# Patient Record
Sex: Female | Born: 1960
Health system: Southern US, Community
[De-identification: ages and names within clinical notes are randomized; demographics above are authoritative.]

## PROBLEM LIST (undated history)

## (undated) DIAGNOSIS — B019 Varicella without complication: Secondary | ICD-10-CM

## (undated) DIAGNOSIS — T7840XA Allergy, unspecified, initial encounter: Secondary | ICD-10-CM

## (undated) HISTORY — DX: Allergy, unspecified, initial encounter: T78.40XA

## (undated) HISTORY — DX: Varicella without complication: B01.9

## (undated) HISTORY — PX: OTHER SURGICAL HISTORY: SHX169

## (undated) HISTORY — PX: FOOT SURGERY: SHX648

---

## 1998-06-04 ENCOUNTER — Other Ambulatory Visit: Admission: RE | Admit: 1998-06-04 | Discharge: 1998-06-04 | Payer: Self-pay | Admitting: Obstetrics and Gynecology

## 2002-02-24 ENCOUNTER — Other Ambulatory Visit: Admission: RE | Admit: 2002-02-24 | Discharge: 2002-02-24 | Payer: Self-pay | Admitting: *Deleted

## 2003-12-12 ENCOUNTER — Other Ambulatory Visit: Admission: RE | Admit: 2003-12-12 | Discharge: 2003-12-12 | Payer: Self-pay | Admitting: Obstetrics & Gynecology

## 2005-04-07 ENCOUNTER — Ambulatory Visit: Payer: Self-pay | Admitting: *Deleted

## 2006-05-06 ENCOUNTER — Ambulatory Visit: Payer: Self-pay | Admitting: *Deleted

## 2009-04-04 ENCOUNTER — Ambulatory Visit: Payer: Self-pay | Admitting: Internal Medicine

## 2009-04-11 ENCOUNTER — Other Ambulatory Visit: Payer: Self-pay | Admitting: Internal Medicine

## 2009-06-28 ENCOUNTER — Ambulatory Visit: Payer: Self-pay | Admitting: Gastroenterology

## 2010-02-14 LAB — HM COLONOSCOPY: HM Colonoscopy: NORMAL

## 2010-04-05 ENCOUNTER — Ambulatory Visit: Payer: Self-pay | Admitting: Internal Medicine

## 2011-04-07 ENCOUNTER — Ambulatory Visit: Payer: Self-pay | Admitting: Internal Medicine

## 2012-03-08 ENCOUNTER — Telehealth: Payer: Self-pay | Admitting: Internal Medicine

## 2012-03-08 DIAGNOSIS — Z1239 Encounter for other screening for malignant neoplasm of breast: Secondary | ICD-10-CM

## 2012-03-08 NOTE — Telephone Encounter (Signed)
Pt called to get order for [place] she received letter stating it was time for mammogram Pt would like to go early am

## 2012-03-08 NOTE — Telephone Encounter (Signed)
This patient has not been seen by me ever.  Is she from the old practice?  Where does she want the mammogram done?

## 2012-03-09 NOTE — Telephone Encounter (Signed)
Left message asking patient to return my call.

## 2012-03-10 NOTE — Telephone Encounter (Signed)
The order was made on July 1.  Will print out.  But, Since she has not been Seen here yet,  I do not know whether she could be pregnant, which will have to be verified before they will do th e mammogram

## 2012-03-10 NOTE — Telephone Encounter (Signed)
Left another message asking patient to call back

## 2012-03-10 NOTE — Telephone Encounter (Signed)
Patient returned call and stated that she was a patient at you previous office. She would like order faxed to [facility] and she will make her own appt

## 2012-03-12 NOTE — Telephone Encounter (Signed)
Patient notified, I faxed the order to [facility].  She will call and schedule the appt.

## 2012-04-08 ENCOUNTER — Ambulatory Visit: Payer: Self-pay | Admitting: Internal Medicine

## 2012-09-08 HISTORY — PX: ABLATION SAPHENOUS VEIN W/ RFA: SUR11

## 2013-02-14 ENCOUNTER — Encounter: Payer: Self-pay | Admitting: Internal Medicine

## 2013-02-14 ENCOUNTER — Ambulatory Visit (INDEPENDENT_AMBULATORY_CARE_PROVIDER_SITE_OTHER): Payer: 59 | Admitting: Internal Medicine

## 2013-02-14 VITALS — BP 112/68 | HR 89 | Temp 98.6°F | Resp 16 | Ht 65.0 in | Wt 165.5 lb

## 2013-02-14 DIAGNOSIS — N951 Menopausal and female climacteric states: Secondary | ICD-10-CM

## 2013-02-14 DIAGNOSIS — Z1239 Encounter for other screening for malignant neoplasm of breast: Secondary | ICD-10-CM

## 2013-02-14 DIAGNOSIS — Z0000 Encounter for general adult medical examination without abnormal findings: Secondary | ICD-10-CM

## 2013-02-14 NOTE — Progress Notes (Signed)
Patient ID: Victoria Barrett, female   DOB: February 23, 1961, 51 y.o.   MRN: [id number]   Patient Active Problem List   Diagnosis Date Noted  . Perimenopausal symptoms 02/14/2013  . Routine general medical examination at a health care facility 02/14/2013    Subjective:  CC:   Chief Complaint  Patient presents with  . Establish Care    HPI:   Victoria Barrett a 52 y.o. female who presents Perimenopausal menstrual irregularity.  She has a menses every  3 to 4 weeks with bleeding lasting 7 days total  With a regular taper.  Her period came early at  3 weeks and started yesterday.  She has been having hot flashes for over a year.    Had bilateral ablation of saphenous veins in Dec by Dr. Wyn Quaker and is scheduled to have sclerotherapy for management of venous insufficiency .  She is wearing the compression stockings daily.   HM: she had a colonoscopy 2011 due to FH of colon polyps and colon CA.  Procedure was done by  Oh , was clear this time.    Has  lost 5 lbs since last visit .  She is exeecising regularly and has no persistent joint pain.    Past Medical History  Diagnosis Date  . Chicken pox     Past Surgical History  Procedure Laterality Date  . Foot surgery Right     ganglion cyst, removed by Troxler  . Salpingo ophect Left        The following portions of the patient's history were reviewed and updated as appropriate: Allergies, current medications, and problem list.    Review of Systems:   12 Pt  review of systems was negative except those addressed in the HPI,     History   Social History  . Marital Status: Single    Spouse Name: N/A    Number of Children: N/A  . Years of Education: N/A   Occupational History  . Not on file.   Social History Main Topics  . Smoking status: Never Smoker   . Smokeless tobacco: Never Used  . Alcohol Use: No  . Drug Use: No  . Sexually Active: No   Other Topics Concern  . Not on file   Social History Narrative  . No  narrative on file    Objective:  BP 112/68  Pulse 89  Temp(Src) 98.6 F (37 C) (Oral)  Resp 16  Ht 5\' 5"  (1.651 m)  Wt 165 lb 8 oz (75.07 kg)  BMI 27.54 kg/m2  SpO2 98%  LMP 02/13/2013  General appearance: alert, cooperative and appears stated age Ears: normal TM's and external ear canals both ears Throat: lips, mucosa, and tongue normal; teeth and gums normal Neck: no adenopathy, no carotid bruit, supple, symmetrical, trachea midline and thyroid not enlarged, symmetric, no tenderness/mass/nodules Back: symmetric, no curvature. ROM normal. No CVA tenderness. Lungs: clear to auscultation bilaterally Heart: regular rate and rhythm, S1, S2 normal, no murmur, click, rub or gallop Abdomen: soft, non-tender; bowel sounds normal; no masses,  no organomegaly Pulses: 2+ and symmetric Skin: Skin color, texture, turgor normal. No rashes or lesions Lymph nodes: Cervical, supraclavicular, and axillary nodes normal.  Assessment and Plan:  Perimenopausal symptoms Annual physical was planned but she has been menstruating .   Routine general medical examination at a health care facility Annual comprehensive exam was done excluding breast, pelvic and PAP smear. She will return for PAP smear when not menstruating.  Mammogram  ordered .    Updated Medication List Outpatient Encounter Prescriptions as of 02/14/2013  Medication Sig Dispense Refill  . docusate sodium (COLACE) 250 MG capsule Take 250 mg by mouth daily.      . Multiple Vitamins-Minerals (MULTIVITAMIN WITH MINERALS) tablet Take 1 tablet by mouth daily.      . naproxen sodium (ANAPROX) 220 MG tablet Take 220 mg by mouth 2 (two) times daily with a meal.       No facility-administered encounter medications on file as of 02/14/2013.     Orders Placed This Encounter  Procedures  . MM Digital Screening  . HM COLONOSCOPY    No Follow-up on file.

## 2013-02-14 NOTE — Patient Instructions (Addendum)
Return for a pelvic exam and fasting bloodwork

## 2013-02-14 NOTE — Assessment & Plan Note (Signed)
Annual physical was planned but she has been menstruating .

## 2013-02-14 NOTE — Assessment & Plan Note (Signed)
Annual comprehensive exam was done excluding breast, pelvic and PAP smear. She will return for PAP smear when not menstruating.  Mammogram ordered .

## 2013-03-08 ENCOUNTER — Encounter: Payer: Self-pay | Admitting: Internal Medicine

## 2013-03-08 ENCOUNTER — Other Ambulatory Visit (HOSPITAL_COMMUNITY)
Admission: RE | Admit: 2013-03-08 | Discharge: 2013-03-08 | Disposition: A | Discharge status: Home / Self Care | Payer: 59 | Source: Ambulatory Visit | Attending: Internal Medicine | Admitting: Internal Medicine

## 2013-03-08 ENCOUNTER — Ambulatory Visit (INDEPENDENT_AMBULATORY_CARE_PROVIDER_SITE_OTHER): Payer: 59 | Admitting: Internal Medicine

## 2013-03-08 VITALS — BP 110/72 | HR 70 | Temp 98.6°F | Resp 14 | Wt 163.2 lb

## 2013-03-08 DIAGNOSIS — Z1151 Encounter for screening for human papillomavirus (HPV): Secondary | ICD-10-CM | POA: Insufficient documentation

## 2013-03-08 DIAGNOSIS — Z01419 Encounter for gynecological examination (general) (routine) without abnormal findings: Secondary | ICD-10-CM | POA: Insufficient documentation

## 2013-03-08 DIAGNOSIS — Z0000 Encounter for general adult medical examination without abnormal findings: Secondary | ICD-10-CM

## 2013-03-08 DIAGNOSIS — R5381 Other malaise: Secondary | ICD-10-CM

## 2013-03-08 DIAGNOSIS — Z13220 Encounter for screening for lipoid disorders: Secondary | ICD-10-CM

## 2013-03-08 LAB — LIPID PANEL
Cholesterol: 172 mg/dL (ref 0–200)
HDL: 53.5 mg/dL (ref >39.00)
Triglycerides: 137 mg/dL (ref 0.0–149.0)
VLDL: 27.4 mg/dL (ref 0.0–40.0)

## 2013-03-08 LAB — CBC WITH DIFFERENTIAL/PLATELET
Basophils Absolute: 0.1 10*3/uL (ref 0.0–0.1)
Eosinophils Relative: 5.5 % — ABNORMAL HIGH (ref 0.0–5.0)
HCT: 40.9 % (ref 36.0–46.0)
Lymphocytes Relative: 22 % (ref 12.0–46.0)
Lymphs Abs: 1.7 10*3/uL (ref 0.7–4.0)
Monocytes Relative: 8.5 % (ref 3.0–12.0)
Neutrophils Relative %: 63.2 % (ref 43.0–77.0)
Platelets: 197 10*3/uL (ref 150.0–400.0)
RDW: 14.5 % (ref 11.5–14.6)
WBC: 7.8 10*3/uL (ref 4.5–10.5)

## 2013-03-08 LAB — TSH: TSH: 1.4 u[IU]/mL (ref 0.35–5.50)

## 2013-03-08 LAB — COMPREHENSIVE METABOLIC PANEL
Albumin: 4.2 g/dL (ref 3.5–5.2)
Alkaline Phosphatase: 42 U/L (ref 39–117)
BUN: 15 mg/dL (ref 6–23)
Glucose, Bld: 93 mg/dL (ref 70–99)
Potassium: 4.3 mEq/L (ref 3.5–5.1)
Total Bilirubin: 0.7 mg/dL (ref 0.3–1.2)

## 2013-03-08 LAB — HM PAP SMEAR

## 2013-03-08 NOTE — Progress Notes (Signed)
Patient ID: Victoria Barrett, female   DOB: [birth year], 52 y.o.   MRN: [id number]    Subjective:     Victoria Barrett is a 52 y.o. female here for a routine exam.  Current complaints:   Personal health questionnaire reviewed: yes.   Gynecologic History Patient's last menstrual period was 02/12/2013. Contraception: abstinence Last Pap:  2008 Results were: normal Last mammogram: 2013. Results were: normal  Obstetric History OB History   Grav Para Term Preterm Abortions TAB SAB Ect Mult Living                   The following portions of the patient's history were reviewed and updated as appropriate: allergies, current medications, past family history, past medical history, past social history, past surgical history and problem list.  Review of Systems A comprehensive review of systems was negative.    Objective:     .BP 110/72  Pulse 70  Temp(Src) 98.6 F (37 C) (Oral)  Resp 14  Wt 163 lb 4 oz (74.05 kg)  BMI 27.17 kg/m2  SpO2 98%  LMP 02/12/2013  General Appearance:    Alert, cooperative, no distress, appears stated age  Head:    Normocephalic, without obvious abnormality, atraumatic  Eyes:    PERRL, conjunctiva/corneas clear, EOM's intact, fundi    benign, both eyes  Ears:    Normal TM's and external ear canals, both ears  Nose:   Nares normal, septum midline, mucosa normal, no drainage    or sinus tenderness  Throat:   Lips, mucosa, and tongue normal; teeth and gums normal  Neck:   Supple, symmetrical, trachea midline, no adenopathy;    thyroid:  no enlargement/tenderness/nodules; no carotid   bruit or JVD  Back:     Symmetric, no curvature, ROM normal, no CVA tenderness  Lungs:     Clear to auscultation bilaterally, respirations unlabored  Chest Wall:    No tenderness or deformity   Heart:    Regular rate and rhythm, S1 and S2 normal, no murmur, rub   or gallop  Breast Exam:    No tenderness, masses, or nipple abnormality  Abdomen:     Soft, non-tender, bowel sounds  active all four quadrants,    no masses, no organomegaly  Genitalia:    Pelvic: cervix normal in appearance, external genitalia normal, no adnexal masses or tenderness, no cervical motion tenderness, rectovaginal septum normal, uterus normal size, shape, and consistency and vagina normal without discharge  Extremities:   Extremities normal, atraumatic, no cyanosis or edema  Pulses:   2+ and symmetric all extremities  Skin:   Skin color, texture, turgor normal, no rashes or lesions  Lymph nodes:   Cervical, supraclavicular, and axillary nodes normal  Neurologic:   CNII-XII intact, normal strength, sensation and reflexes    throughout        Assessment:   Routine general medical examination at a health care facility Annual comprehensive exam was done including breast, pelvic and PAP smear. All screenings have been addressed .    Updated Medication List Outpatient Encounter Prescriptions as of 03/08/2013  Medication Sig Dispense Refill  . docusate sodium (COLACE) 250 MG capsule Take 250 mg by mouth daily.      . Multiple Vitamins-Minerals (MULTIVITAMIN WITH MINERALS) tablet Take 1 tablet by mouth daily.      . naproxen sodium (ANAPROX) 220 MG tablet Take 220 mg by mouth 2 (two) times daily with a meal.       No  facility-administered encounter medications on file as of 03/08/2013.

## 2013-03-09 ENCOUNTER — Encounter: Payer: Self-pay | Admitting: Internal Medicine

## 2013-03-09 NOTE — Assessment & Plan Note (Signed)
Annual comprehensive exam was done including breast, pelvic and PAP smear. All screenings have been addressed .  

## 2013-03-10 ENCOUNTER — Encounter: Payer: Self-pay | Admitting: Internal Medicine

## 2013-03-15 ENCOUNTER — Encounter: Payer: Self-pay | Admitting: Internal Medicine

## 2013-04-03 LAB — HM PAP SMEAR: HM PAP: NEGATIVE

## 2013-04-13 ENCOUNTER — Ambulatory Visit: Payer: Self-pay | Admitting: Internal Medicine

## 2013-04-13 LAB — HM MAMMOGRAPHY: HM MAMMO: NORMAL

## 2013-05-03 ENCOUNTER — Encounter: Payer: Self-pay | Admitting: Internal Medicine

## 2013-07-14 ENCOUNTER — Other Ambulatory Visit: Payer: Self-pay

## 2014-04-03 ENCOUNTER — Encounter: Payer: Self-pay | Admitting: Internal Medicine

## 2014-04-03 ENCOUNTER — Ambulatory Visit (INDEPENDENT_AMBULATORY_CARE_PROVIDER_SITE_OTHER): Payer: 59 | Admitting: Internal Medicine

## 2014-04-03 VITALS — BP 120/78 | HR 85 | Temp 98.1°F | Resp 16 | Ht 65.75 in | Wt 167.2 lb

## 2014-04-03 DIAGNOSIS — Z0000 Encounter for general adult medical examination without abnormal findings: Secondary | ICD-10-CM

## 2014-04-03 DIAGNOSIS — K5904 Chronic idiopathic constipation: Secondary | ICD-10-CM

## 2014-04-03 DIAGNOSIS — N951 Menopausal and female climacteric states: Secondary | ICD-10-CM

## 2014-04-03 DIAGNOSIS — Z1239 Encounter for other screening for malignant neoplasm of breast: Secondary | ICD-10-CM

## 2014-04-03 DIAGNOSIS — K5909 Other constipation: Secondary | ICD-10-CM

## 2014-04-03 MED ORDER — LINACLOTIDE 290 MCG PO CAPS: 290.0000 ug | ORAL_CAPSULE | Freq: Every day | ORAL | Status: DC

## 2014-04-03 NOTE — Progress Notes (Signed)
Patient ID: Victoria Barrett, female   DOB: 06/14/1961, 53 y.o.   MRN: [id number]    Subjective:     Victoria Barrett is a 53 y.o. female and is here for a comprehensive physical exam. The patient reports problems - as  follows.annual nongyn exam    using estroblend OTC for daytime hot flashes and moodiness.  peirods are regular and still heavy.  Travel constipation still occurring.      History   Social History  . Marital Status: Single    Spouse Name: N/A    Number of Children: N/A  . Years of Education: N/A   Occupational History  . Not on file.   Social History Main Topics  . Smoking status: Never Smoker   . Smokeless tobacco: Never Used  . Alcohol Use: No  . Drug Use: No  . Sexual Activity: No   Other Topics Concern  . Not on file   Social History Narrative  . No narrative on file   Health Maintenance  Topic Date Due  . Influenza Vaccine  04/08/2014  . Mammogram  04/14/2015  . Pap Smear  04/03/2016  . Tetanus/tdap  12/17/2019  . Colonoscopy  02/15/2020    The following portions of the patient's history were reviewed and updated as appropriate: current medications, past family history, past medical history, past social history, past surgical history and problem list.  Review of Systems A comprehensive review of systems was negative.   Objective:  BP 120/78  Pulse 85  Temp(Src) 98.1 F (36.7 C) (Oral)  Resp 16  Ht 5' 5.75" (1.67 m)  Wt 167 lb 4 oz (75.864 kg)  BMI 27.20 kg/m2  SpO2 98%  LMP 03/06/2014  General appearance: alert, cooperative and appears stated age Head: Normocephalic, without obvious abnormality, atraumatic Eyes: conjunctivae/corneas clear. PERRL, EOM's intact. Fundi benign. Ears: normal TM's and external ear canals both ears Nose: Nares normal. Septum midline. Mucosa normal. No drainage or sinus tenderness. Throat: lips, mucosa, and tongue normal; teeth and gums normal Neck: no adenopathy, no carotid bruit, no JVD, supple,  symmetrical, trachea midline and thyroid not enlarged, symmetric, no tenderness/mass/nodules Lungs: clear to auscultation bilaterally Breasts: normal appearance, no masses or tenderness Heart: regular rate and rhythm, S1, S2 normal, no murmur, click, rub or gallop Abdomen: soft, non-tender; bowel sounds normal; no masses,  no organomegaly Extremities: extremities normal, atraumatic, no cyanosis or edema Pulses: 2+ and symmetric Skin: Skin color, texture, turgor normal. No rashes or lesions Neurologic: Alert and oriented X 3, normal strength and tone. Normal symmetric reflexes. Normal coordination and gait.   Assessment and Plan:   Routine general medical examination at a health care facility Annual comprehensive exam was done including breast, excluding  pelvic and PAP smear. All screenings have been addressed .   Perimenopausal symptoms Managed with Estroblend, an OTC soy based medication   Constipation - functional Occurring when away from home .  Trial of Linzess advised.    Updated Medication List Outpatient Encounter Prescriptions as of 04/03/2014  Medication Sig  . docusate sodium (COLACE) 250 MG capsule Take 250 mg by mouth daily.  . Multiple Vitamins-Minerals (MULTIVITAMIN WITH MINERALS) tablet Take 1 tablet by mouth daily.  . naproxen sodium (ANAPROX) 220 MG tablet Take 220 mg by mouth 2 (two) times daily with a meal.  . Nutritional Supplements (EQ ESTROBLEND MENOPAUSE PO) Take 1 tablet by mouth daily.  . Linaclotide (LINZESS) 290 MCG CAPS capsule Take 1 capsule (290 mcg total) by mouth  daily.

## 2014-04-03 NOTE — Progress Notes (Signed)
Pre-visit discussion using our clinic review tool. No additional management support is needed unless otherwise documented below in the visit note.  

## 2014-04-03 NOTE — Patient Instructions (Signed)
You had your annual  wellness exam today.  We will repeat your PAP smear in 2017, sooner if needed   We will schedule your mammogram in August

## 2014-04-04 DIAGNOSIS — K5904 Chronic idiopathic constipation: Secondary | ICD-10-CM | POA: Insufficient documentation

## 2014-04-04 NOTE — Assessment & Plan Note (Signed)
Managed with Estroblend, an OTC soy based medication

## 2014-04-04 NOTE — Assessment & Plan Note (Signed)
Occurring when away from home .  Trial of Linzess advised.

## 2014-04-04 NOTE — Assessment & Plan Note (Signed)
Annual comprehensive exam was done including breast, excluding pelvic and PAP smear. All screenings have been addressed .  

## 2014-05-23 ENCOUNTER — Ambulatory Visit: Payer: Self-pay | Admitting: Internal Medicine

## 2014-12-11 ENCOUNTER — Encounter: Payer: Self-pay | Admitting: *Deleted

## 2015-05-23 ENCOUNTER — Encounter: Payer: Self-pay | Admitting: Internal Medicine

## 2015-05-23 ENCOUNTER — Other Ambulatory Visit: Payer: Self-pay | Admitting: Internal Medicine

## 2015-05-23 ENCOUNTER — Ambulatory Visit (INDEPENDENT_AMBULATORY_CARE_PROVIDER_SITE_OTHER): Payer: 59 | Admitting: Internal Medicine

## 2015-05-23 VITALS — BP 114/72 | HR 84 | Temp 98.2°F | Resp 14 | Ht 65.5 in | Wt 170.5 lb

## 2015-05-23 DIAGNOSIS — R350 Frequency of micturition: Secondary | ICD-10-CM

## 2015-05-23 DIAGNOSIS — Z0000 Encounter for general adult medical examination without abnormal findings: Secondary | ICD-10-CM | POA: Diagnosis not present

## 2015-05-23 DIAGNOSIS — E559 Vitamin D deficiency, unspecified: Secondary | ICD-10-CM | POA: Diagnosis not present

## 2015-05-23 DIAGNOSIS — Z113 Encounter for screening for infections with a predominantly sexual mode of transmission: Secondary | ICD-10-CM

## 2015-05-23 DIAGNOSIS — Z1239 Encounter for other screening for malignant neoplasm of breast: Secondary | ICD-10-CM | POA: Diagnosis not present

## 2015-05-23 DIAGNOSIS — R5383 Other fatigue: Secondary | ICD-10-CM | POA: Diagnosis not present

## 2015-05-23 DIAGNOSIS — E785 Hyperlipidemia, unspecified: Secondary | ICD-10-CM | POA: Diagnosis not present

## 2015-05-23 LAB — URINALYSIS, ROUTINE W REFLEX MICROSCOPIC
BILIRUBIN URINE: NEGATIVE
Hgb urine dipstick: NEGATIVE
KETONES UR: NEGATIVE
LEUKOCYTES UA: NEGATIVE
Nitrite: NEGATIVE
PH: 7.5 (ref 5.0–8.0)
RBC / HPF: NONE SEEN (ref 0–?)
Specific Gravity, Urine: 1.015 (ref 1.000–1.030)
TOTAL PROTEIN, URINE-UPE24: NEGATIVE
UROBILINOGEN UA: 0.2 (ref 0.0–1.0)
Urine Glucose: NEGATIVE

## 2015-05-23 LAB — COMPREHENSIVE METABOLIC PANEL
ALBUMIN: 4.1 g/dL (ref 3.5–5.2)
ALT: 17 U/L (ref 0–35)
AST: 18 U/L (ref 0–37)
Alkaline Phosphatase: 48 U/L (ref 39–117)
BUN: 8 mg/dL (ref 6–23)
CHLORIDE: 104 meq/L (ref 96–112)
CO2: 27 mEq/L (ref 19–32)
Calcium: 9.2 mg/dL (ref 8.4–10.5)
Creatinine, Ser: 0.62 mg/dL (ref 0.40–1.20)
GFR: 106.81 mL/min (ref >60.00)
GLUCOSE: 86 mg/dL (ref 70–99)
Potassium: 4.3 mEq/L (ref 3.5–5.1)
Sodium: 138 mEq/L (ref 135–145)
Total Bilirubin: 0.3 mg/dL (ref 0.2–1.2)
Total Protein: 7.3 g/dL (ref 6.0–8.3)

## 2015-05-23 LAB — CBC WITH DIFFERENTIAL/PLATELET
BASOS ABS: 0 10*3/uL (ref 0.0–0.1)
Basophils Relative: 0.4 % (ref 0.0–3.0)
Eosinophils Absolute: 0.2 10*3/uL (ref 0.0–0.7)
Eosinophils Relative: 3 % (ref 0.0–5.0)
HCT: 41.7 % (ref 36.0–46.0)
Hemoglobin: 13.9 g/dL (ref 12.0–15.0)
LYMPHS ABS: 2 10*3/uL (ref 0.7–4.0)
Lymphocytes Relative: 25.1 % (ref 12.0–46.0)
MCHC: 33.4 g/dL (ref 30.0–36.0)
MCV: 90.9 fl (ref 78.0–100.0)
MONOS PCT: 8.5 % (ref 3.0–12.0)
Monocytes Absolute: 0.7 10*3/uL (ref 0.1–1.0)
NEUTROS ABS: 5.1 10*3/uL (ref 1.4–7.7)
NEUTROS PCT: 63 % (ref 43.0–77.0)
PLATELETS: 227 10*3/uL (ref 150.0–400.0)
RBC: 4.59 Mil/uL (ref 3.87–5.11)
RDW: 14.4 % (ref 11.5–15.5)
WBC: 8.1 10*3/uL (ref 4.0–10.5)

## 2015-05-23 LAB — LIPID PANEL
CHOL/HDL RATIO: 3
Cholesterol: 188 mg/dL (ref 0–200)
HDL: 59.3 mg/dL (ref >39.00)
LDL Cholesterol: 92 mg/dL (ref 0–99)
NONHDL: 128.41
Triglycerides: 181 mg/dL — ABNORMAL HIGH (ref 0.0–149.0)
VLDL: 36.2 mg/dL (ref 0.0–40.0)

## 2015-05-23 LAB — VITAMIN D 25 HYDROXY (VIT D DEFICIENCY, FRACTURES): VITD: 25.63 ng/mL — AB (ref 30.00–100.00)

## 2015-05-23 LAB — TSH: TSH: 1.69 u[IU]/mL (ref 0.35–4.50)

## 2015-05-23 NOTE — Patient Instructions (Signed)

## 2015-05-23 NOTE — Progress Notes (Signed)
Pre-visit discussion using our clinic review tool. No additional management support is needed unless otherwise documented below in the visit note.  

## 2015-05-24 ENCOUNTER — Ambulatory Visit: Payer: 59 | Admitting: Anesthesiology

## 2015-05-24 ENCOUNTER — Ambulatory Visit
Admission: RE | Admit: 2015-05-24 | Discharge: 2015-05-24 | Disposition: A | Discharge status: Home / Self Care | Payer: 59 | Source: Ambulatory Visit | Attending: Gastroenterology | Admitting: Gastroenterology

## 2015-05-24 ENCOUNTER — Encounter
Admission: RE | Disposition: A | Discharge status: Home / Self Care | Payer: Self-pay | Source: Ambulatory Visit | Attending: Gastroenterology

## 2015-05-24 DIAGNOSIS — Z885 Allergy status to narcotic agent status: Secondary | ICD-10-CM | POA: Insufficient documentation

## 2015-05-24 DIAGNOSIS — Z1211 Encounter for screening for malignant neoplasm of colon: Secondary | ICD-10-CM | POA: Diagnosis present

## 2015-05-24 DIAGNOSIS — K5730 Diverticulosis of large intestine without perforation or abscess without bleeding: Secondary | ICD-10-CM | POA: Insufficient documentation

## 2015-05-24 DIAGNOSIS — Z79899 Other long term (current) drug therapy: Secondary | ICD-10-CM | POA: Diagnosis not present

## 2015-05-24 DIAGNOSIS — Z800 Family history of malignant neoplasm of digestive organs: Secondary | ICD-10-CM | POA: Diagnosis not present

## 2015-05-24 HISTORY — PX: COLONOSCOPY WITH PROPOFOL: SHX5780

## 2015-05-24 LAB — HEPATITIS C ANTIBODY: HCV Ab: NEGATIVE

## 2015-05-24 LAB — HIV ANTIBODY (ROUTINE TESTING W REFLEX): HIV 1&2 Ab, 4th Generation: NONREACTIVE

## 2015-05-24 LAB — HM COLONOSCOPY

## 2015-05-24 SURGERY — COLONOSCOPY WITH PROPOFOL
Anesthesia: General

## 2015-05-24 MED ORDER — SODIUM CHLORIDE 0.9 % IV SOLN
INTRAVENOUS | Status: DC
  Administered 2015-05-24 (×2): via INTRAVENOUS

## 2015-05-24 MED ORDER — FENTANYL CITRATE (PF) 100 MCG/2ML IJ SOLN
INTRAMUSCULAR | Status: DC | PRN
  Administered 2015-05-24: 50 ug via INTRAVENOUS

## 2015-05-24 MED ORDER — SODIUM CHLORIDE 0.9 % IV SOLN: INTRAVENOUS | Status: DC

## 2015-05-24 MED ORDER — MIDAZOLAM HCL 2 MG/2ML IJ SOLN
INTRAMUSCULAR | Status: DC | PRN
  Administered 2015-05-24: 1 mg via INTRAVENOUS

## 2015-05-24 NOTE — Op Note (Signed)
[facility] Patient Name: Victoria Barrett Procedure Date: 05/24/2015 11:39 AM MRN: [id number] Account #: [id number] Date of Birth: Nov 11, 1960 Admit Type: Outpatient Age: 54 Room: [facility] ENDO ROOM 3 Gender: Female Note Status: Finalized Procedure:         Colonoscopy Indications:       Family history of colon cancer in a first-degree relative,                     Family history of colonic polyps in a first-degree relative Providers:         Ezzard Standing. Bluford Kaufmann, MD Referring MD:      Duncan Dull, MD (Referring MD) Medicines:         Monitored Anesthesia Care Complications:     No immediate complications. Procedure:         Pre-Anesthesia Assessment:                    - Prior to the procedure, a History and Physical was                     performed, and patient medications, allergies and                     sensitivities were reviewed. The patient's tolerance of                     previous anesthesia was reviewed.                    - The risks and benefits of the procedure and the sedation                     options and risks were discussed with the patient. All                     questions were answered and informed consent was obtained.                    - After reviewing the risks and benefits, the patient was                     deemed in satisfactory condition to undergo the procedure.                    After obtaining informed consent, the colonoscope was                     passed under direct vision. Throughout the procedure, the                     patient's blood pressure, pulse, and oxygen saturations                     were monitored continuously. The Olympus CF-H180AL                     colonoscope ( S#: [id number] ) was introduced through the                     anus and advanced to the the cecum, identified by                     appendiceal orifice and ileocecal valve. The colonoscopy  was performed without difficulty. The  patient tolerated                     the procedure well. The quality of the bowel preparation                     was fair. Findings:      A few small-mouthed diverticula were found in the sigmoid colon.      The exam was otherwise without abnormality. Impression:        - Diverticulosis in the sigmoid colon.                    - The examination was otherwise normal.                    - No specimens collected. Recommendation:    - Discharge patient to home.                    - Repeat colonoscopy in 5 years for surveillance.                    - The findings and recommendations were discussed with the                     patient. Procedure Code(s): --- Professional ---                    [phone], Colonoscopy, flexible; diagnostic, including                     collection of specimen(s) by brushing or washing, when                     performed (separate procedure) Diagnosis Code(s): --- Professional ---                    Z80.0, Family history of malignant neoplasm of digestive                     organs                    Z83.71, Family history of colonic polyps                    K57.30, Diverticulosis of large intestine without                     perforation or abscess without bleeding CPT copyright 2014 American Medical Association. All rights reserved. The codes documented in this report are preliminary and upon coder review may  be revised to meet current compliance requirements. Wallace Cullens, MD 05/24/2015 11:56:45 AM This report has been signed electronically. Number of Addenda: 0 Note Initiated On: 05/24/2015 11:39 AM Scope Withdrawal Time: 0 hours 4 minutes 36 seconds  Total Procedure Duration: 0 hours 9 minutes 35 seconds       [facility]

## 2015-05-24 NOTE — H&P (Signed)
Primary Care Physician:  Sherlene Shams, MD Primary Gastroenterologist:  Dr. Bluford Kaufmann  Pre-Procedure History & Physical: HPI:  Victoria Barrett is a 54 y.o. female is here for an colonoscopy   Past Medical History  Diagnosis Date  . Chicken pox     Past Surgical History  Procedure Laterality Date  . Foot surgery Right     ganglion cyst, removed by Troxler  . Salpingo ophect Left   . Ablation saphenous vein w/ rfa Bilateral 2014    Prior to Admission medications   Medication Sig Start Date End Date Taking? Authorizing Provider  docusate sodium (COLACE) 250 MG capsule Take 250 mg by mouth daily.   Yes Historical Provider, MD  Linaclotide (LINZESS) 290 MCG CAPS capsule Take 1 capsule (290 mcg total) by mouth daily. Patient taking differently: Take 290 mcg by mouth daily as needed.  04/03/14  Yes Sherlene Shams, MD  Multiple Vitamins-Minerals (MULTIVITAMIN WITH MINERALS) tablet Take 1 tablet by mouth daily.   Yes Historical Provider, MD  naproxen sodium (ANAPROX) 220 MG tablet Take 220 mg by mouth 2 (two) times daily with a meal.   Yes Historical Provider, MD  Nutritional Supplements (EQ ESTROBLEND MENOPAUSE PO) Take 1 tablet by mouth daily.    Historical Provider, MD    Allergies as of 04/18/2015 - Review Complete 04/03/2014  Allergen Reaction Noted  . Codeine Other (See Comments) 02/14/2013    Family History  Problem Relation Age of Onset  . Arthritis Mother   . Diabetes Mother   . Hypertension Mother   . Heart disease Maternal Grandmother   . Hypertension Maternal Grandmother   . Heart disease Maternal Grandfather   . Heart disease Paternal Grandmother   . Cancer Paternal Grandmother     colon polyps  . Heart disease Paternal Grandfather   . Cancer Father 54    colon polyps, bladder Cancer stage 1  . Cancer Sister     colon polyps  . Cancer Maternal Uncle 22    colon    Social History   Social History  . Marital Status: Single    Spouse Name: N/A  . Number of  Children: N/A  . Years of Education: N/A   Occupational History  . Not on file.   Social History Main Topics  . Smoking status: Never Smoker   . Smokeless tobacco: Never Used  . Alcohol Use: No  . Drug Use: No  . Sexual Activity: No   Other Topics Concern  . Not on file   Social History Narrative    Review of Systems: See HPI, otherwise negative ROS  Physical Exam: BP 136/64 mmHg  Pulse 78  Temp(Src) 97.5 F (36.4 C) (Tympanic)  Resp 18  Ht 5' 5.5" (1.664 m)  Wt 77.111 kg (170 lb)  BMI 27.85 kg/m2  SpO2 100%  LMP 04/23/2015 General:   Alert,  pleasant and cooperative in NAD Head:  Normocephalic and atraumatic. Neck:  Supple; no masses or thyromegaly. Lungs:  Clear throughout to auscultation.    Heart:  Regular rate and rhythm. Abdomen:  Soft, nontender and nondistended. Normal bowel sounds, without guarding, and without rebound.   Neurologic:  Alert and  oriented x4;  grossly normal neurologically.  Impression/Plan: Victoria Barrett is here for an colonoscopy to be performed for family hx of colon cancer and polyps.  Risks, benefits, limitations, and alternatives regarding  colonoscopy have been reviewed with the patient.  Questions have been answered.  All parties  agreeable.   Victoria Barrett, Victoria Standing, MD  05/24/2015, 11:35 AM

## 2015-05-24 NOTE — Anesthesia Postprocedure Evaluation (Signed)
  Anesthesia Post-op Note  Patient: Victoria Barrett  Procedure(s) Performed: Procedure(s): COLONOSCOPY WITH PROPOFOL (N/A)  Anesthesia type:General  Patient location: PACU  Post pain: Pain level controlled  Post assessment: Post-op Vital signs reviewed, Patient's Cardiovascular Status Stable, Respiratory Function Stable, Patent Airway and No signs of Nausea or vomiting  Post vital signs: Reviewed and stable  Last Vitals:  Filed Vitals:   05/24/15 1210  BP: 118/69  Pulse: 82  Temp:   Resp: 9    Level of consciousness: awake, alert  and patient cooperative  Complications: No apparent anesthesia complications

## 2015-05-24 NOTE — Assessment & Plan Note (Addendum)
Annual wellness  exam was done as well as a comprehensive physical exam  .  During the course of the visit the patient was educated and counseled about appropriate screening and preventive services and screenings were brought up to date for cervical and breast cancer .  She has had  fasting labs which show no evidence of hyperlipidemia or diabetes. nutrition counseling, skin cancer screening has been done and she has been advised to follow up with dermatology for an annual skin check . Marland Kitchen  She gets her age appropiate recommended immunizations at [facility] as an employee.  Printed recommendations for health maintenance screenings was given.   Lab Results  Component Value Date   CHOL 188 05/23/2015   HDL 59.30 05/23/2015   LDLCALC 92 05/23/2015   TRIG 181.0* 05/23/2015   CHOLHDL 3 05/23/2015

## 2015-05-24 NOTE — Transfer of Care (Signed)
Immediate Anesthesia Transfer of Care Note  Patient: Victoria Barrett  Procedure(s) Performed: Procedure(s): COLONOSCOPY WITH PROPOFOL (N/A)  Patient Location: PACU and Endoscopy Unit  Anesthesia Type:General  Level of Consciousness: patient cooperative and lethargic  Airway & Oxygen Therapy: Patient Spontanous Breathing and Patient connected to nasal cannula oxygen  Post-op Assessment: Report given to RN and Post -op Vital signs reviewed and stable  Post vital signs: Reviewed and stable  Last Vitals:  Filed Vitals:   05/24/15 1202  BP: 129/66  Pulse: 96  Temp: 36.2 C  Resp: 20    Complications: No apparent anesthesia complications

## 2015-05-24 NOTE — Anesthesia Preprocedure Evaluation (Signed)
Anesthesia Evaluation  Patient identified by MRN, date of birth, ID band Patient awake    Reviewed: Allergy & Precautions, NPO status , Patient's Chart, lab work & pertinent test results  Airway Mallampati: II       Dental no notable dental hx.    Pulmonary neg pulmonary ROS,    Pulmonary exam normal        Cardiovascular negative cardio ROS Normal cardiovascular exam     Neuro/Psych    GI/Hepatic negative GI ROS, Neg liver ROS,   Endo/Other  negative endocrine ROS  Renal/GU negative Renal ROS     Musculoskeletal negative musculoskeletal ROS (+)   Abdominal Normal abdominal exam  (+)   Peds negative pediatric ROS (+)  Hematology negative hematology ROS (+)   Anesthesia Other Findings   Reproductive/Obstetrics                             Anesthesia Physical Anesthesia Plan  ASA: II  Anesthesia Plan: General   Post-op Pain Management:    Induction: Intravenous  Airway Management Planned: Nasal Cannula  Additional Equipment:   Intra-op Plan:   Post-operative Plan:   Informed Consent: I have reviewed the patients History and Physical, chart, labs and discussed the procedure including the risks, benefits and alternatives for the proposed anesthesia with the patient or authorized representative who has indicated his/her understanding and acceptance.     Plan Discussed with: CRNA  Anesthesia Plan Comments:         Anesthesia Quick Evaluation

## 2015-05-24 NOTE — Progress Notes (Signed)
Patient ID: Victoria Barrett, female    DOB: Jun 08, 1961  Age: 54 y.o. MRN: [id number]  The patient is here for annual wellness examination and management of other chronic and acute problems.   The risk factors are reflected in the social history.  The roster of all physicians providing medical care to patient - is listed in the Snapshot section of the chart.  Home safety : The patient has smoke detectors in the home. They wear seatbelts.  There are no firearms at home. There is no violence in the home.   There is no risks for hepatitis, STDs or HIV. There is no   history of blood transfusion. They have no travel history to infectious disease endemic areas of the world.  The patient has seen their dentist in the last six month. They have seen their eye doctor in the last year. They admit to slight hearing difficulty with regard to whispered voices and some television programs.  They have deferred audiologic testing in the last year.  They do not  have excessive sun exposure. Discussed the need for sun protection: hats, long sleeves and use of sunscreen if there is significant sun exposure.   Diet: the importance of a healthy diet is discussed. They do have a healthy diet.  The benefits of regular aerobic exercise were discussed. She walks 4 times per week ,  20 minutes.   Depression screen: there are no signs or vegative symptoms of depression- irritability, change in appetite, anhedonia, sadness/tearfullness.  The following portions of the patient's history were reviewed and updated as appropriate: allergies, current medications, past family history, past medical history,  past surgical history, past social history  and problem list.  Visual acuity was not assessed per patient preference since she has regular follow up with her ophthalmologist. Hearing and body mass index were assessed and reviewed.   During the course of the visit the patient was educated and counseled about appropriate screening  and preventive services including : fall prevention , diabetes screening, nutrition counseling, colorectal cancer screening, and recommended immunizations.    CC: The primary encounter diagnosis was Screen for STD (sexually transmitted disease). Diagnoses of Vitamin D deficiency, Other fatigue, Screening for breast cancer, Hyperlipidemia, and Visit for preventive health examination were also pertinent to this visit.  History Izabel has a past medical history of Chicken pox.   She has past surgical history that includes Foot surgery (Right); salpingo ophect (Left); and Ablation saphenous vein w/ RFA (Bilateral, 2014).   Her family history includes Arthritis in her mother; Cancer in her paternal grandmother and sister; Cancer (age of onset: 2) in her maternal uncle; Cancer (age of onset: 51) in her father; Diabetes in her mother; Heart disease in her maternal grandfather, maternal grandmother, paternal grandfather, and paternal grandmother; Hypertension in her maternal grandmother and mother.She reports that she has never smoked. She has never used smokeless tobacco. She reports that she does not drink alcohol or use illicit drugs.  Outpatient Prescriptions Prior to Visit  Medication Sig Dispense Refill  . docusate sodium (COLACE) 250 MG capsule Take 250 mg by mouth daily.    . Linaclotide (LINZESS) 290 MCG CAPS capsule Take 1 capsule (290 mcg total) by mouth daily. (Patient taking differently: Take 290 mcg by mouth daily as needed. ) 30 capsule 0  . Multiple Vitamins-Minerals (MULTIVITAMIN WITH MINERALS) tablet Take 1 tablet by mouth daily.    . naproxen sodium (ANAPROX) 220 MG tablet Take 220 mg by mouth 2 (two)  times daily with a meal.    . Nutritional Supplements (EQ ESTROBLEND MENOPAUSE PO) Take 1 tablet by mouth daily.     No facility-administered medications prior to visit.    Review of Systems   Patient denies headache, fevers, malaise, unintentional weight loss, skin rash, eye pain,  sinus congestion and sinus pain, sore throat, dysphagia,  hemoptysis , cough, dyspnea, wheezing, chest pain, palpitations, orthopnea, edema, abdominal pain, nausea, melena, diarrhea, constipation, flank pain, dysuria, hematuria, urinary  Frequency, nocturia, numbness, tingling, seizures,  Focal weakness, Loss of consciousness,  Tremor, insomnia, depression, anxiety, and suicidal ideation.     Objective:  BP 114/72 mmHg  Pulse 84  Temp(Src) 98.2 F (36.8 C) (Oral)  Resp 14  Ht 5' 5.5" (1.664 m)  Wt 170 lb 8 oz (77.338 kg)  BMI 27.93 kg/m2  SpO2 99%  LMP 04/14/2015 (Exact Date)  Physical Exam  General appearance: alert, cooperative and appears stated age Head: Normocephalic, without obvious abnormality, atraumatic Eyes: conjunctivae/corneas clear. PERRL, EOM's intact. Fundi benign. Ears: normal TM's and external ear canals both ears Nose: Nares normal. Septum midline. Mucosa normal. No drainage or sinus tenderness. Throat: lips, mucosa, and tongue normal; teeth and gums normal Neck: no adenopathy, no carotid bruit, no JVD, supple, symmetrical, trachea midline and thyroid not enlarged, symmetric, no tenderness/mass/nodules Lungs: clear to auscultation bilaterally Breasts: normal appearance, no masses or tenderness Heart: regular rate and rhythm, S1, S2 normal, no murmur, click, rub or gallop Abdomen: soft, non-tender; bowel sounds normal; no masses,  no organomegaly Extremities: extremities normal, atraumatic, no cyanosis or edema Pulses: 2+ and symmetric Skin: Skin color, texture, turgor normal. No rashes or lesions Neurologic: Alert and oriented X 3, normal strength and tone. Normal symmetric reflexes. Normal coordination and gait.    Assessment & Plan:   Problem List Items Addressed This Visit      Unprioritized   Visit for preventive health examination    Annual wellness  exam was done as well as a comprehensive physical exam  .  During the course of the visit the patient was  educated and counseled about appropriate screening and preventive services and screenings were brought up to date for cervical and breast cancer .  She has had  fasting labs which show no evidence of hyperlipidemia or diabetes. nutrition counseling, skin cancer screening has been done and she has been advised to follow up with dermatology for an annual skin check . Marland Kitchen  She gets her age appropiate recommended immunizations at [facility] as an employee.  Printed recommendations for health maintenance screenings was given.   Lab Results  Component Value Date   CHOL 188 05/23/2015   HDL 59.30 05/23/2015   LDLCALC 92 05/23/2015   TRIG 181.0* 05/23/2015   CHOLHDL 3 05/23/2015          Other Visit Diagnoses    Screen for STD (sexually transmitted disease)    -  Primary    Relevant Orders    HIV antibody (Completed)    Hepatitis C antibody (Completed)    Urinalysis, Routine w reflex microscopic (Completed)    Vitamin D deficiency        Relevant Orders    Vit D  25 hydroxy (rtn osteoporosis monitoring) (Completed)    Urinalysis, Routine w reflex microscopic (Completed)    Other fatigue        Relevant Orders    CBC with Differential/Platelet (Completed)    Comprehensive metabolic panel (Completed)    TSH (Completed)  Urinalysis, Routine w reflex microscopic (Completed)    Screening for breast cancer        Relevant Orders    MM DIGITAL SCREENING BILATERAL    Urinalysis, Routine w reflex microscopic (Completed)    Hyperlipidemia        Relevant Orders    Lipid panel (Completed)    Urinalysis, Routine w reflex microscopic (Completed)       I am having Ms. Arias maintain her multivitamin with minerals, docusate sodium, naproxen sodium, Nutritional Supplements (EQ ESTROBLEND MENOPAUSE PO), and Linaclotide.  No orders of the defined types were placed in this encounter.    There are no discontinued medications.  Follow-up: No Follow-up on file.   Sherlene Shams, MD

## 2015-05-25 ENCOUNTER — Encounter: Payer: Self-pay | Admitting: Internal Medicine

## 2015-05-25 MED ORDER — ERGOCALCIFEROL 1.25 MG (50000 UT) PO CAPS: 50000.0000 [IU] | ORAL_CAPSULE | ORAL | Status: DC

## 2015-05-25 NOTE — Addendum Note (Signed)
Addended by: Sherlene Shams on: 05/25/2015 07:11 AM   Modules accepted: Orders, SmartSet

## 2015-05-26 ENCOUNTER — Encounter: Payer: Self-pay | Admitting: Gastroenterology

## 2015-05-31 ENCOUNTER — Encounter: Payer: Self-pay | Admitting: Internal Medicine

## 2015-05-31 ENCOUNTER — Other Ambulatory Visit: Payer: Self-pay | Admitting: Internal Medicine

## 2015-05-31 ENCOUNTER — Ambulatory Visit
Admission: RE | Admit: 2015-05-31 | Discharge: 2015-05-31 | Disposition: A | Discharge status: Home / Self Care | Payer: 59 | Source: Ambulatory Visit | Attending: Internal Medicine | Admitting: Internal Medicine

## 2015-05-31 DIAGNOSIS — Z1231 Encounter for screening mammogram for malignant neoplasm of breast: Secondary | ICD-10-CM | POA: Diagnosis present

## 2015-05-31 DIAGNOSIS — Z1239 Encounter for other screening for malignant neoplasm of breast: Secondary | ICD-10-CM

## 2015-06-01 ENCOUNTER — Encounter: Payer: 59 | Admitting: Internal Medicine

## 2015-06-22 NOTE — Telephone Encounter (Signed)
Mailed unread message to patient.  

## 2015-11-22 ENCOUNTER — Ambulatory Visit (INDEPENDENT_AMBULATORY_CARE_PROVIDER_SITE_OTHER): Payer: 59 | Admitting: Family Medicine

## 2015-11-22 ENCOUNTER — Encounter: Payer: Self-pay | Admitting: Family Medicine

## 2015-11-22 VITALS — BP 130/76 | HR 95 | Temp 97.7°F | Ht 60.5 in | Wt 171.0 lb

## 2015-11-22 DIAGNOSIS — J0100 Acute maxillary sinusitis, unspecified: Secondary | ICD-10-CM

## 2015-11-22 MED ORDER — AMOXICILLIN-POT CLAVULANATE 875-125 MG PO TABS: 1.0000 | ORAL_TABLET | Freq: Two times a day (BID) | ORAL | Status: DC

## 2015-11-22 MED ORDER — BENZONATATE 200 MG PO CAPS: 200.0000 mg | ORAL_CAPSULE | Freq: Two times a day (BID) | ORAL | Status: DC | PRN

## 2015-11-22 NOTE — Assessment & Plan Note (Signed)
Patient's symptoms most consistent with maxillary sinusitis. Given that she is a week into the illness with no improvement I suspect bacterial cause. We'll treat with Augmentin for this. Also Flonase and Claritin over-the-counter. Tessalon for cough. Suspect vertigo is either related to BPPV or her sinus infection. She is neurologically intact. Dix-Hallpike did reproduce vertiginous symptoms. I discussed the Epley maneuver with her. She will continue to monitor. If not improving in the next 2-3 days with her congestion she will let us know. If vertigo is persistent next week she will also inform us. She is given return precautions.

## 2015-11-22 NOTE — Patient Instructions (Signed)
Nice to meet you. You likely have a sinus infection leading to your symptoms. Your vertigo could be related to the sinus infection or BPPV. We will treat you with Augmentin for the sinus infection. You should take Flonase 2 sprays each nostril daily and can also take Claritin over-the-counter. We will have you do the Epley maneuver to help with your vertigo. If you develop shortness of breath, cough productive of blood, fevers, numbness, weakness, vision changes, persistent vertigo, or any new or changing symptoms please seek medical attention.

## 2015-11-22 NOTE — Progress Notes (Signed)
Pre visit review using our clinic review tool, if applicable. No additional management support is needed unless otherwise documented below in the visit note. 

## 2015-11-22 NOTE — Progress Notes (Signed)
Patient ID: VANTASIA PINKNEY, female   DOB: 1961/03/06, 55 y.o.   MRN: [id number]  Marikay Alar, MD Phone: [phone]  Ilisha Blust Aracena is a 55 y.o. female who presents today for same-day visit.  Patient reports nasal and sinus congestion starting last Friday. Also associated with cough productive of yellow sputum. She is blowing green yellow mucus out of her nose. Notes frontal and maxillary sinus pressure and headache. She notes she's also had some vertigo with this. Notes it occurs when she changes head position when laying down in bed or when she sits up. Described as room spinning. No ear pain, hearing loss, or tinnitus. No numbness, weakness, or vision changes. No shortness of breath. No fevers. She's been taking over-the-counter Mucinex, Delsym, and meclizine. She notes her symptoms have not improved as of yet.  PMH: nonsmoker.   ROS see history of present illness  Objective  Physical Exam Filed Vitals:   11/22/15 0855  BP: 130/76  Pulse: 95  Temp: 97.7 F (36.5 C)    BP Readings from Last 3 Encounters:  11/22/15 130/76  05/24/15 117/68  05/23/15 114/72   Wt Readings from Last 3 Encounters:  11/22/15 171 lb (77.565 kg)  05/24/15 170 lb (77.111 kg)  05/23/15 170 lb 8 oz (77.338 kg)   Laying blood pressure 146/86, pulse 90 Sitting blood pressure 138/84, pulse 92 Standing blood pressure 128/84, pulse 104  Physical Exam  Constitutional: She is well-developed, well-nourished, and in no distress.  HENT:  Head: Normocephalic and atraumatic.  Right Ear: External ear normal.  Left Ear: External ear normal.  Mouth/Throat: Oropharynx is clear and moist. No oropharyngeal exudate.  Normal TMs bilaterally  Eyes: Conjunctivae are normal. Pupils are equal, round, and reactive to light.  Neck: Neck supple.  Cardiovascular: Normal rate, regular rhythm and normal heart sounds.   Pulmonary/Chest: Effort normal and breath sounds normal.  Musculoskeletal: She exhibits no edema.    Lymphadenopathy:    She has no cervical adenopathy.  Neurological: She is alert.  CN 2-12 intact, 5/5 strength in bilateral biceps, triceps, grip, quads, hamstrings, plantar and dorsiflexion, sensation to light touch intact in bilateral UE and LE, normal gait, 2+ patellar reflexes, no nystagmus with Dix-Hallpike, though vertigo precipitated by Dix-Hallpike maneuver on right and left side  Skin: Skin is warm and dry. She is not diaphoretic.     Assessment/Plan: Please see individual problem list.  Maxillary sinusitis, acute Patient's symptoms most consistent with maxillary sinusitis. Given that she is a week into the illness with no improvement I suspect bacterial cause. We'll treat with Augmentin for this. Also Flonase and Claritin over-the-counter. Tessalon for cough. Suspect vertigo is either related to BPPV or her sinus infection. She is neurologically intact. Dix-Hallpike did reproduce vertiginous symptoms. I discussed the Epley maneuver with her. She will continue to monitor. If not improving in the next 2-3 days with her congestion she will let us know. If vertigo is persistent next week she will also inform us. She is given return precautions.  Orthostatics negative. Advised to stay well hydrated.  No orders of the defined types were placed in this encounter.    Meds ordered this encounter  Medications  . amoxicillin-clavulanate (AUGMENTIN) 875-125 MG tablet    Sig: Take 1 tablet by mouth 2 (two) times daily.    Dispense:  14 tablet    Refill:  0  . benzonatate (TESSALON) 200 MG capsule    Sig: Take 1 capsule (200 mg total) by mouth 2 (  two) times daily as needed for cough.    Dispense:  20 capsule    Refill:  0    Marikay AlarEric Edra Riccardi, MD [facility] [place]

## 2016-05-06 ENCOUNTER — Other Ambulatory Visit: Payer: Self-pay | Admitting: Internal Medicine

## 2016-05-06 DIAGNOSIS — Z1231 Encounter for screening mammogram for malignant neoplasm of breast: Secondary | ICD-10-CM

## 2016-05-26 ENCOUNTER — Encounter: Payer: 59 | Admitting: Internal Medicine

## 2016-06-02 ENCOUNTER — Encounter: Payer: Self-pay | Admitting: Internal Medicine

## 2016-06-02 ENCOUNTER — Ambulatory Visit
Admission: RE | Admit: 2016-06-02 | Discharge: 2016-06-02 | Disposition: A | Discharge status: Home / Self Care | Payer: 59 | Source: Ambulatory Visit | Attending: Internal Medicine | Admitting: Internal Medicine

## 2016-06-02 ENCOUNTER — Other Ambulatory Visit (HOSPITAL_COMMUNITY)
Admission: RE | Admit: 2016-06-02 | Discharge: 2016-06-02 | Disposition: A | Discharge status: Home / Self Care | Payer: 59 | Source: Ambulatory Visit | Attending: Internal Medicine | Admitting: Internal Medicine

## 2016-06-02 ENCOUNTER — Other Ambulatory Visit: Payer: Self-pay | Admitting: Internal Medicine

## 2016-06-02 ENCOUNTER — Ambulatory Visit (INDEPENDENT_AMBULATORY_CARE_PROVIDER_SITE_OTHER): Payer: 59 | Admitting: Internal Medicine

## 2016-06-02 VITALS — BP 128/74 | HR 80 | Temp 98.0°F | Resp 12 | Ht 65.5 in | Wt 168.2 lb

## 2016-06-02 DIAGNOSIS — Z1231 Encounter for screening mammogram for malignant neoplasm of breast: Secondary | ICD-10-CM | POA: Insufficient documentation

## 2016-06-02 DIAGNOSIS — Z124 Encounter for screening for malignant neoplasm of cervix: Secondary | ICD-10-CM

## 2016-06-02 DIAGNOSIS — Z1151 Encounter for screening for human papillomavirus (HPV): Secondary | ICD-10-CM | POA: Insufficient documentation

## 2016-06-02 DIAGNOSIS — Z01419 Encounter for gynecological examination (general) (routine) without abnormal findings: Secondary | ICD-10-CM | POA: Diagnosis not present

## 2016-06-02 DIAGNOSIS — R5382 Chronic fatigue, unspecified: Secondary | ICD-10-CM

## 2016-06-02 DIAGNOSIS — E559 Vitamin D deficiency, unspecified: Secondary | ICD-10-CM

## 2016-06-02 DIAGNOSIS — Z0000 Encounter for general adult medical examination without abnormal findings: Secondary | ICD-10-CM | POA: Diagnosis not present

## 2016-06-02 LAB — COMPREHENSIVE METABOLIC PANEL
ALBUMIN: 3.8 g/dL (ref 3.5–5.2)
ALT: 16 U/L (ref 0–35)
AST: 17 U/L (ref 0–37)
Alkaline Phosphatase: 40 U/L (ref 39–117)
BUN: 14 mg/dL (ref 6–23)
CHLORIDE: 104 meq/L (ref 96–112)
CO2: 31 meq/L (ref 19–32)
Calcium: 9.1 mg/dL (ref 8.4–10.5)
Creatinine, Ser: 0.72 mg/dL (ref 0.40–1.20)
GFR: 89.53 mL/min (ref >60.00)
GLUCOSE: 88 mg/dL (ref 70–99)
POTASSIUM: 4.1 meq/L (ref 3.5–5.1)
SODIUM: 140 meq/L (ref 135–145)
Total Bilirubin: 0.3 mg/dL (ref 0.2–1.2)
Total Protein: 6.8 g/dL (ref 6.0–8.3)

## 2016-06-02 LAB — VITAMIN D 25 HYDROXY (VIT D DEFICIENCY, FRACTURES): VITD: 31.72 ng/mL (ref 30.00–100.00)

## 2016-06-02 LAB — TSH: TSH: 1.48 u[IU]/mL (ref 0.35–4.50)

## 2016-06-02 NOTE — Progress Notes (Signed)
Patient ID: Victoria Barrett, female    DOB: 28-Nov-1960  Age: 55 y.o. MRN: [id number]  The patient is here for annual preventive  examination and management of other chronic and acute problems.   Health Maintenance:  Due for PAP smear, to be done today  Mammogram was done this morning sept 25 TDap received in 2011 Colonoscopy  2016   The risk factors are reflected in the social history.  The roster of all physicians providing medical care to patient - is listed in the Snapshot section of the chart.   Home safety : The patient has smoke detectors in the home. They wear seatbelts.   There is no violence in the home.   There is no risks for hepatitis, STDs or HIV. There is no   history of blood transfusion. They have no travel history to infectious disease endemic areas of the world.  The patient has seen their dentist in the last six month. They have seen their eye doctor in the last year.    Discussed the need for sun protection: hats, long sleeves and use of sunscreen if there is significant sun exposure.   Diet: the importance of a healthy diet is discussed. They do have a healthy diet.  The benefits of regular aerobic exercise were discussed. She walks 4 times per week ,  20 minutes.   Depression screen: there are no signs or vegative symptoms of depression- irritability, change in appetite, anhedonia, sadness/tearfullness.   The following portions of the patient's history were reviewed and updated as appropriate: allergies, current medications, past family history, past medical history,  past surgical history, past social history  and problem list.  Visual acuity was not assessed per patient preference since she has regular follow up with her ophthalmologist. Hearing and body mass index were assessed and reviewed.   During the course of the visit the patient was educated and counseled about appropriate screening and preventive services including : fall prevention , diabetes  screening, nutrition counseling, colorectal cancer screening, and recommended immunizations.    CC: The primary encounter diagnosis was Cervical cancer screening. Diagnoses of Vitamin D deficiency, Chronic fatigue, and Visit for preventive health examination were also pertinent to this visit.  History Victoria Barrett has a past medical history of Chicken pox.   She has a past surgical history that includes Foot surgery (Right); salpingo ophect (Left); Ablation saphenous vein w/ RFA (Bilateral, 2014); and Colonoscopy with propofol (N/A, 05/24/2015).   Her family history includes Arthritis in her mother; Cancer in her paternal grandmother and sister; Cancer (age of onset: 38) in her maternal uncle; Cancer (age of onset: 70) in her father; Diabetes in her mother; Heart disease in her maternal grandfather, maternal grandmother, paternal grandfather, and paternal grandmother; Hypertension in her maternal grandmother and mother.She reports that she has never smoked. She has never used smokeless tobacco. She reports that she does not drink alcohol or use drugs.  Outpatient Medications Prior to Visit  Medication Sig Dispense Refill  . docusate sodium (COLACE) 250 MG capsule Take 250 mg by mouth daily.    . Multiple Vitamins-Minerals (MULTIVITAMIN WITH MINERALS) tablet Take 1 tablet by mouth daily.    . naproxen sodium (ANAPROX) 220 MG tablet Take 220 mg by mouth as needed.     . Nutritional Supplements (EQ ESTROBLEND MENOPAUSE PO) Take 1 tablet by mouth daily.    Marland Kitchen amoxicillin-clavulanate (AUGMENTIN) 875-125 MG tablet Take 1 tablet by mouth 2 (two) times daily. 14 tablet 0  .  benzonatate (TESSALON) 200 MG capsule Take 1 capsule (200 mg total) by mouth 2 (two) times daily as needed for cough. 20 capsule 0  . ergocalciferol (DRISDOL) 50000 UNITS capsule Take 1 capsule (50,000 Units total) by mouth once a week. 4 capsule 0  . Linaclotide (LINZESS) 290 MCG CAPS capsule Take 1 capsule (290 mcg total) by mouth daily.  (Patient taking differently: Take 290 mcg by mouth daily as needed. ) 30 capsule 0   No facility-administered medications prior to visit.     Review of Systems   Patient denies headache, fevers, malaise, unintentional weight loss, skin rash, eye pain, sinus congestion and sinus pain, sore throat, dysphagia,  hemoptysis , cough, dyspnea, wheezing, chest pain, palpitations, orthopnea, edema, abdominal pain, nausea, melena, diarrhea, constipation, flank pain, dysuria, hematuria, urinary  Frequency, nocturia, numbness, tingling, seizures,  Focal weakness, Loss of consciousness,  Tremor, insomnia, depression, anxiety, and suicidal ideation.     Objective:  BP 128/74   Pulse 80   Temp 98 F (36.7 C) (Oral)   Resp 12   Ht 5' 5.5" (1.664 m)   Wt 168 lb 4 oz (76.3 kg)   LMP 05/26/2016   SpO2 99%   BMI 27.57 kg/m   Physical Exam   General Appearance:    Alert, cooperative, no distress, appears stated age  Head:    Normocephalic, without obvious abnormality, atraumatic  Eyes:    PERRL, conjunctiva/corneas clear, EOM's intact, fundi    benign, both eyes  Ears:    Normal TM's and external ear canals, both ears  Nose:   Nares normal, septum midline, mucosa normal, no drainage    or sinus tenderness  Throat:   Lips, mucosa, and tongue normal; teeth and gums normal  Neck:   Supple, symmetrical, trachea midline, no adenopathy;    thyroid:  no enlargement/tenderness/nodules; no carotid   bruit or JVD  Back:     Symmetric, no curvature, ROM normal, no CVA tenderness  Lungs:     Clear to auscultation bilaterally, respirations unlabored  Chest Wall:    No tenderness or deformity   Heart:    Regular rate and rhythm, S1 and S2 normal, no murmur, rub   or gallop  Breast Exam:    No tenderness, masses, or nipple abnormality  Abdomen:     Soft, non-tender, bowel sounds active all four quadrants,    no masses, no organomegaly  Genitalia:    Pelvic: cervix normal in appearance, external genitalia  normal, no adnexal masses or tenderness, no cervical motion tenderness, rectovaginal septum normal, uterus normal size, shape, and consistency and vagina normal without discharge  Extremities:   Extremities normal, atraumatic, no cyanosis or edema  Pulses:   2+ and symmetric all extremities  Skin:   Skin color, texture, turgor normal, no rashes or lesions  Lymph nodes:   Cervical, supraclavicular, and axillary nodes normal  Neurologic:   CNII-XII intact, normal strength, sensation and reflexes    throughout     Assessment & Plan:   Problem List Items Addressed This Visit    Visit for preventive health examination    Annual comprehensive preventive exam was done as well as an evaluation and management of chronic conditions .  During the course of the visit the patient was educated and counseled about appropriate screening and preventive services including :  diabetes screening, lipid analysis with projected  10 year  risk for CAD , nutrition counseling, breast, cervical and colorectal cancer screening, and recommended immunizations.  Printed recommendations for health maintenance screenings was given.  Lab Results  Component Value Date   TSH 1.48 06/02/2016   Lab Results  Component Value Date   CREATININE 0.72 06/02/2016   Lab Results  Component Value Date   NA 140 06/02/2016   K 4.1 06/02/2016   CL 104 06/02/2016   CO2 31 06/02/2016   Lab Results  Component Value Date   ALT 16 06/02/2016   AST 17 06/02/2016   ALKPHOS 40 06/02/2016   BILITOT 0.3 06/02/2016   Lab Results  Component Value Date   LDLCALC 92 05/23/2015          Other Visit Diagnoses    Cervical cancer screening    -  Primary   Relevant Orders   Cytology - PAP   Vitamin D deficiency       Relevant Orders   VITAMIN D 25 Hydroxy (Vit-D Deficiency, Fractures) (Completed)   Chronic fatigue       Relevant Orders   Comprehensive metabolic panel (Completed)   TSH (Completed)      I have discontinued Ms.  Clayton's linaclotide, ergocalciferol, amoxicillin-clavulanate, and benzonatate. I am also having her maintain her multivitamin with minerals, docusate sodium, naproxen sodium, Nutritional Supplements (EQ ESTROBLEND MENOPAUSE PO), and Vitamin D3.  Meds ordered this encounter  Medications  . Cholecalciferol (VITAMIN D3) 2000 units TABS    Sig: Take 1 tablet by mouth daily.    Medications Discontinued During This Encounter  Medication Reason  . amoxicillin-clavulanate (AUGMENTIN) 875-125 MG tablet Completed Course  . benzonatate (TESSALON) 200 MG capsule Error  . ergocalciferol (DRISDOL) 50000 UNITS capsule Error  . Linaclotide (LINZESS) 290 MCG CAPS capsule Error    Follow-up: No Follow-up on file.   Sherlene Shams, MD

## 2016-06-02 NOTE — Progress Notes (Signed)
Pre-visit discussion using our clinic review tool. No additional management support is needed unless otherwise documented below in the visit note.  

## 2016-06-02 NOTE — Patient Instructions (Signed)
Health Maintenance, Female Adopting a healthy lifestyle and getting preventive care can go a long way to promote health and wellness. Talk with your health care provider about what schedule of regular examinations is right for you. This is a good chance for you to check in with your provider about disease prevention and staying healthy. In between checkups, there are plenty of things you can do on your own. Experts have done a lot of research about which lifestyle changes and preventive measures are most likely to keep you healthy. Ask your health care provider for more information. WEIGHT AND DIET  Eat a healthy diet  Be sure to include plenty of vegetables, fruits, low-fat dairy products, and lean protein.  Do not eat a lot of foods high in solid fats, added sugars, or salt.  Get regular exercise. This is one of the most important things you can do for your health.  Most adults should exercise for at least 150 minutes each week. The exercise should increase your heart rate and make you sweat (moderate-intensity exercise).  Most adults should also do strengthening exercises at least twice a week. This is in addition to the moderate-intensity exercise.  Maintain a healthy weight  Body mass index (BMI) is a measurement that can be used to identify possible weight problems. It estimates body fat based on height and weight. Your health care provider can help determine your BMI and help you achieve or maintain a healthy weight.  For females 20 years of age and older:   A BMI below 18.5 is considered underweight.  A BMI of 18.5 to 24.9 is normal.  A BMI of 25 to 29.9 is considered overweight.  A BMI of 30 and above is considered obese.  Watch levels of cholesterol and blood lipids  You should start having your blood tested for lipids and cholesterol at 54 years of age, then have this test every 5 years.  You may need to have your cholesterol levels checked more often if:  Your lipid  or cholesterol levels are high.  You are older than 54 years of age.  You are at high risk for heart disease.  CANCER SCREENING   Lung Cancer  Lung cancer screening is recommended for adults 55-80 years old who are at high risk for lung cancer because of a history of smoking.  A yearly low-dose CT scan of the lungs is recommended for people who:  Currently smoke.  Have quit within the past 15 years.  Have at least a 30-pack-year history of smoking. A pack year is smoking an average of one pack of cigarettes a day for 1 year.  Yearly screening should continue until it has been 15 years since you quit.  Yearly screening should stop if you develop a health problem that would prevent you from having lung cancer treatment.  Breast Cancer  Practice breast self-awareness. This means understanding how your breasts normally appear and feel.  It also means doing regular breast self-exams. Let your health care provider know about any changes, no matter how small.  If you are in your 20s or 30s, you should have a clinical breast exam (CBE) by a health care provider every 1-3 years as part of a regular health exam.  If you are 40 or older, have a CBE every year. Also consider having a breast X-ray (mammogram) every year.  If you have a family history of breast cancer, talk to your health care provider about genetic screening.  If you   are at high risk for breast cancer, talk to your health care provider about having an MRI and a mammogram every year.  Breast cancer gene (BRCA) assessment is recommended for women who have family members with BRCA-related cancers. BRCA-related cancers include:  Breast.  Ovarian.  Tubal.  Peritoneal cancers.  Results of the assessment will determine the need for genetic counseling and BRCA1 and BRCA2 testing. Cervical Cancer Your health care provider may recommend that you be screened regularly for cancer of the pelvic organs (ovaries, uterus, and  vagina). This screening involves a pelvic examination, including checking for microscopic changes to the surface of your cervix (Pap test). You may be encouraged to have this screening done every 3 years, beginning at age 21.  For women ages 30-65, health care providers may recommend pelvic exams and Pap testing every 3 years, or they may recommend the Pap and pelvic exam, combined with testing for human papilloma virus (HPV), every 5 years. Some types of HPV increase your risk of cervical cancer. Testing for HPV may also be done on women of any age with unclear Pap test results.  Other health care providers may not recommend any screening for nonpregnant women who are considered low risk for pelvic cancer and who do not have symptoms. Ask your health care provider if a screening pelvic exam is right for you.  If you have had past treatment for cervical cancer or a condition that could lead to cancer, you need Pap tests and screening for cancer for at least 20 years after your treatment. If Pap tests have been discontinued, your risk factors (such as having a new sexual partner) need to be reassessed to determine if screening should resume. Some women have medical problems that increase the chance of getting cervical cancer. In these cases, your health care provider may recommend more frequent screening and Pap tests. Colorectal Cancer  This type of cancer can be detected and often prevented.  Routine colorectal cancer screening usually begins at 54 years of age and continues through 54 years of age.  Your health care provider may recommend screening at an earlier age if you have risk factors for colon cancer.  Your health care provider may also recommend using home test kits to check for hidden blood in the stool.  A small camera at the end of a tube can be used to examine your colon directly (sigmoidoscopy or colonoscopy). This is done to check for the earliest forms of colorectal  cancer.  Routine screening usually begins at age 50.  Direct examination of the colon should be repeated every 5-10 years through 54 years of age. However, you may need to be screened more often if early forms of precancerous polyps or small growths are found. Skin Cancer  Check your skin from head to toe regularly.  Tell your health care provider about any new moles or changes in moles, especially if there is a change in a mole's shape or color.  Also tell your health care provider if you have a mole that is larger than the size of a pencil eraser.  Always use sunscreen. Apply sunscreen liberally and repeatedly throughout the day.  Protect yourself by wearing long sleeves, pants, a wide-brimmed hat, and sunglasses whenever you are outside. HEART DISEASE, DIABETES, AND HIGH BLOOD PRESSURE   High blood pressure causes heart disease and increases the risk of stroke. High blood pressure is more likely to develop in:  People who have blood pressure in the high end   of the normal range (130-139/85-89 mm Hg).  People who are overweight or obese.  People who are African American.  If you are 38-23 years of age, have your blood pressure checked every 3-5 years. If you are 61 years of age or older, have your blood pressure checked every year. You should have your blood pressure measured twice--once when you are at a hospital or clinic, and once when you are not at a hospital or clinic. Record the average of the two measurements. To check your blood pressure when you are not at a hospital or clinic, you can use:  An automated blood pressure machine at a pharmacy.  A home blood pressure monitor.  If you are between 45 years and 39 years old, ask your health care provider if you should take aspirin to prevent strokes.  Have regular diabetes screenings. This involves taking a blood sample to check your fasting blood sugar level.  If you are at a normal weight and have a low risk for diabetes,  have this test once every three years after 54 years of age.  If you are overweight and have a high risk for diabetes, consider being tested at a younger age or more often. PREVENTING INFECTION  Hepatitis B  If you have a higher risk for hepatitis B, you should be screened for this virus. You are considered at high risk for hepatitis B if:  You were born in a country where hepatitis B is common. Ask your health care provider which countries are considered high risk.  Your parents were born in a high-risk country, and you have not been immunized against hepatitis B (hepatitis B vaccine).  You have HIV or AIDS.  You use needles to inject street drugs.  You live with someone who has hepatitis B.  You have had sex with someone who has hepatitis B.  You get hemodialysis treatment.  You take certain medicines for conditions, including cancer, organ transplantation, and autoimmune conditions. Hepatitis C  Blood testing is recommended for:  Everyone born from [birth year] through 1964.  Anyone with known risk factors for hepatitis C. Sexually transmitted infections (STIs)  You should be screened for sexually transmitted infections (STIs) including gonorrhea and chlamydia if:  You are sexually active and are younger than 54 years of age.  You are older than 53 years of age and your health care provider tells you that you are at risk for this type of infection.  Your sexual activity has changed since you were last screened and you are at an increased risk for chlamydia or gonorrhea. Ask your health care provider if you are at risk.  If you do not have HIV, but are at risk, it may be recommended that you take a prescription medicine daily to prevent HIV infection. This is called pre-exposure prophylaxis (PrEP). You are considered at risk if:  You are sexually active and do not regularly use condoms or know the HIV status of your partner(s).  You take drugs by injection.  You are sexually  active with a partner who has HIV. Talk with your health care provider about whether you are at high risk of being infected with HIV. If you choose to begin PrEP, you should first be tested for HIV. You should then be tested every 3 months for as long as you are taking PrEP.  PREGNANCY   If you are premenopausal and you may become pregnant, ask your health care provider about preconception counseling.  If you may  become pregnant, take 400 to 800 micrograms (mcg) of folic acid every day.  If you want to prevent pregnancy, talk to your health care provider about birth control (contraception). OSTEOPOROSIS AND MENOPAUSE   Osteoporosis is a disease in which the bones lose minerals and strength with aging. This can result in serious bone fractures. Your risk for osteoporosis can be identified using a bone density scan.  If you are 61 years of age or older, or if you are at risk for osteoporosis and fractures, ask your health care provider if you should be screened.  Ask your health care provider whether you should take a calcium or vitamin D supplement to lower your risk for osteoporosis.  Menopause may have certain physical symptoms and risks.  Hormone replacement therapy may reduce some of these symptoms and risks. Talk to your health care provider about whether hormone replacement therapy is right for you.  HOME CARE INSTRUCTIONS   Schedule regular health, dental, and eye exams.  Stay current with your immunizations.   Do not use any tobacco products including cigarettes, chewing tobacco, or electronic cigarettes.  If you are pregnant, do not drink alcohol.  If you are breastfeeding, limit how much and how often you drink alcohol.  Limit alcohol intake to no more than 1 drink per day for nonpregnant women. One drink equals 12 ounces of beer, 5 ounces of wine, or 1 ounces of hard liquor.  Do not use street drugs.  Do not share needles.  Ask your health care provider for help if  you need support or information about quitting drugs.  Tell your health care provider if you often feel depressed.  Tell your health care provider if you have ever been abused or do not feel safe at home.   This information is not intended to replace advice given to you by your health care provider. Make sure you discuss any questions you have with your health care provider.   Document Released: 03/10/2011 Document Revised: 09/15/2014 Document Reviewed: 07/27/2013 Elsevier Interactive Patient Education [employer].

## 2016-06-03 ENCOUNTER — Encounter: Payer: Self-pay | Admitting: Internal Medicine

## 2016-06-03 LAB — CYTOLOGY - PAP

## 2016-06-03 NOTE — Assessment & Plan Note (Addendum)
Annual comprehensive preventive exam was done as well as an evaluation and management of chronic conditions .  During the course of the visit the patient was educated and counseled about appropriate screening and preventive services including :  diabetes screening, lipid analysis with projected  10 year  risk for CAD , nutrition counseling, breast, cervical and colorectal cancer screening, and recommended immunizations.  Printed recommendations for health maintenance screenings was given.  Lab Results  Component Value Date   TSH 1.48 06/02/2016   Lab Results  Component Value Date   CREATININE 0.72 06/02/2016   Lab Results  Component Value Date   NA 140 06/02/2016   K 4.1 06/02/2016   CL 104 06/02/2016   CO2 31 06/02/2016   Lab Results  Component Value Date   ALT 16 06/02/2016   AST 17 06/02/2016   ALKPHOS 40 06/02/2016   BILITOT 0.3 06/02/2016   Lab Results  Component Value Date   LDLCALC 92 05/23/2015

## 2016-06-04 ENCOUNTER — Encounter: Payer: Self-pay | Admitting: Internal Medicine

## 2016-06-05 ENCOUNTER — Encounter: Payer: Self-pay | Admitting: Internal Medicine

## 2016-07-24 DIAGNOSIS — H5203 Hypermetropia, bilateral: Secondary | ICD-10-CM | POA: Diagnosis not present

## 2016-07-24 DIAGNOSIS — H524 Presbyopia: Secondary | ICD-10-CM | POA: Diagnosis not present

## 2017-04-20 ENCOUNTER — Other Ambulatory Visit: Payer: Self-pay | Admitting: Internal Medicine

## 2017-04-20 DIAGNOSIS — Z1231 Encounter for screening mammogram for malignant neoplasm of breast: Secondary | ICD-10-CM

## 2017-06-05 ENCOUNTER — Encounter: Payer: Self-pay | Admitting: Internal Medicine

## 2017-06-05 ENCOUNTER — Ambulatory Visit (INDEPENDENT_AMBULATORY_CARE_PROVIDER_SITE_OTHER): Payer: 59 | Admitting: Internal Medicine

## 2017-06-05 VITALS — BP 112/78 | HR 83 | Temp 98.4°F | Resp 15 | Ht 65.25 in | Wt 174.0 lb

## 2017-06-05 DIAGNOSIS — E663 Overweight: Secondary | ICD-10-CM

## 2017-06-05 DIAGNOSIS — R5383 Other fatigue: Secondary | ICD-10-CM

## 2017-06-05 DIAGNOSIS — Z1231 Encounter for screening mammogram for malignant neoplasm of breast: Secondary | ICD-10-CM | POA: Diagnosis not present

## 2017-06-05 DIAGNOSIS — Z0000 Encounter for general adult medical examination without abnormal findings: Secondary | ICD-10-CM | POA: Diagnosis not present

## 2017-06-05 DIAGNOSIS — Z1239 Encounter for other screening for malignant neoplasm of breast: Secondary | ICD-10-CM

## 2017-06-05 DIAGNOSIS — E785 Hyperlipidemia, unspecified: Secondary | ICD-10-CM

## 2017-06-05 NOTE — Progress Notes (Signed)
Patient ID: Victoria Barrett, female    DOB: [birth year]  Age: 56 y.o. MRN: [id number]  The patient is here for annual non gyn preventive  examination and management of other chronic and acute problems.   PAP SMEAR NORMAL 2017 MAMMOGRAM  SET UP  COLON DUE 2021   The risk factors are reflected in the social history.  The roster of all physicians providing medical care to patient - is listed in the Snapshot section of the chart.  Activities of daily living:  The patient is 100% independent in all ADLs: dressing, toileting, feeding as well as independent mobility  Home safety : The patient has smoke detectors in the home. They wear seatbelts.  There are no firearms at home. There is no violence in the home.   There is no risks for hepatitis, STDs or HIV. There is no   history of blood transfusion. They have no travel history to infectious disease endemic areas of the world.  The patient has seen their dentist in the last six month. They have seen their eye doctor in the last year.   Discussed the need for sun protection: hats, long sleeves and use of sunscreen if there is significant sun exposure.   Diet: the importance of a healthy diet is discussed. They do have a healthy diet.  The benefits of regular aerobic exercise were discussed. She does not exercise regularly and has gained weight .   Depression screen: there are no signs or vegative symptoms of depression- irritability, change in appetite, anhedonia, sadness/tearfullness.  Cognitive assessment: the patient manages all their financial and personal affairs and is actively engaged. They could relate day,date,year and events; recalled 2/3 objects at 3 minutes; performed clock-face test normally.  The following portions of the patient's history were reviewed and updated as appropriate: allergies, current medications, past family history, past medical history,  past surgical history, past social history  and problem list.  Visual acuity  was not assessed per patient preference since she has regular follow up with her ophthalmologist. Hearing and body mass index were assessed and reviewed.   During the course of the visit the patient was educated and counseled about appropriate screening and preventive services including : fall prevention , diabetes screening, nutrition counseling, colorectal cancer screening, and recommended immunizations.    CC: The primary encounter diagnosis was Screening breast examination. Diagnoses of Fatigue, unspecified type, Hyperlipidemia LDL goal <100, Visit for preventive health examination, and Overweight (BMI 25.0-29.9) were also pertinent to this visit.  History Victoria Barrett has a past medical history of Chicken pox.   She has a past surgical history that includes Foot surgery (Right); salpingo ophect (Left); Ablation saphenous vein w/ RFA (Bilateral, 2014); and Colonoscopy with propofol (N/A, 05/24/2015).   Her family history includes Arthritis in her mother; Cancer in her paternal grandmother and sister; Cancer (age of onset: 63) in her maternal uncle; Cancer (age of onset: 71) in her father; Diabetes in her mother; Heart disease in her maternal grandfather, maternal grandmother, paternal grandfather, and paternal grandmother; Hypertension in her maternal grandmother and mother.She reports that she has never smoked. She has never used smokeless tobacco. She reports that she does not drink alcohol or use drugs.  Outpatient Medications Prior to Visit  Medication Sig Dispense Refill  . docusate sodium (COLACE) 250 MG capsule Take 250 mg by mouth daily.    . Multiple Vitamins-Minerals (MULTIVITAMIN WITH MINERALS) tablet Take 1 tablet by mouth daily.    . naproxen sodium (ANAPROX) 220  MG tablet Take 220 mg by mouth as needed.     . Nutritional Supplements (EQ ESTROBLEND MENOPAUSE PO) Take 1 tablet by mouth daily.    . Cholecalciferol (VITAMIN D3) 2000 units TABS Take 1 tablet by mouth daily.     No  facility-administered medications prior to visit.     Review of Systems   Patient denies headache, fevers, malaise, unintentional weight loss, skin rash, eye pain, sinus congestion and sinus pain, sore throat, dysphagia,  hemoptysis , cough, dyspnea, wheezing, chest pain, palpitations, orthopnea, edema, abdominal pain, nausea, melena, diarrhea, constipation, flank pain, dysuria, hematuria, urinary  Frequency, nocturia, numbness, tingling, seizures,  Focal weakness, Loss of consciousness,  Tremor, insomnia, depression, anxiety, and suicidal ideation.      Objective:  BP 112/78 (BP Location: Left Arm, Patient Position: Sitting, Cuff Size: Normal)   Pulse 83   Temp 98.4 F (36.9 C) (Oral)   Resp 15   Ht 5' 5.25" (1.657 m)   Wt 174 lb (78.9 kg)   SpO2 98%   BMI 28.73 kg/m   Physical Exam   General appearance: alert, cooperative and appears stated age Head: Normocephalic, without obvious abnormality, atraumatic Eyes: conjunctivae/corneas clear. PERRL, EOM's intact. Fundi benign. Ears: normal TM's and external ear canals both ears Nose: Nares normal. Septum midline. Mucosa normal. No drainage or sinus tenderness. Throat: lips, mucosa, and tongue normal; teeth and gums normal Neck: no adenopathy, no carotid bruit, no JVD, supple, symmetrical, trachea midline and thyroid not enlarged, symmetric, no tenderness/mass/nodules Lungs: clear to auscultation bilaterally Breasts: normal appearance, no masses or tenderness Heart: regular rate and rhythm, S1, S2 normal, no murmur, click, rub or gallop Abdomen: soft, non-tender; bowel sounds normal; no masses,  no organomegaly Extremities: extremities normal, atraumatic, no cyanosis or edema Pulses: 2+ and symmetric Skin: Skin color, texture, turgor normal. No rashes or lesions Neurologic: Alert and oriented X 3, normal strength and tone. Normal symmetric reflexes. Normal coordination and gait.      Assessment & Plan:   Problem List Items  Addressed This Visit    Overweight (BMI 25.0-29.9)    I have addressed  BMI and recommended a low glycemic index diet utilizing smaller more frequent meals to increase metabolism.  I have also recommended that patient start exercising with a goal of 30 minutes of aerobic exercise a minimum of 5 days per week. Screening for lipid disorders, thyroid and diabetes to be done today.        Visit for preventive health examination    Annual comprehensive preventive exam was done as well as an evaluation and management of chronic conditions .  During the course of the visit the patient was educated and counseled about appropriate screening and preventive services including :  diabetes screening, lipid analysis with projected  10 year  risk for CAD , nutrition counseling, breast, cervical and colorectal cancer screening, and recommended immunizations.  Printed  Lab Results  Component Value Date   CHOL 188 05/23/2015   HDL 59.30 05/23/2015   LDLCALC 92 05/23/2015   LDLDIRECT 85 06/05/2017   TRIG 181.0 (H) 05/23/2015   CHOLHDL 3 05/23/2015    recommendations for health maintenance screenings was given       Other Visit Diagnoses    Screening breast examination    -  Primary   Fatigue, unspecified type       Relevant Orders   TSH (Completed)   Comprehensive metabolic panel (Completed)   Hyperlipidemia LDL goal <100  Relevant Orders   LDL cholesterol, direct (Completed)      I have discontinued Ms. Sandlin's Vitamin D3. I am also having her maintain her multivitamin with minerals, docusate sodium, naproxen sodium, and Nutritional Supplements (EQ ESTROBLEND MENOPAUSE PO).  No orders of the defined types were placed in this encounter.   Medications Discontinued During This Encounter  Medication Reason  . Cholecalciferol (VITAMIN D3) 2000 units TABS Patient has not taken in last 30 days    Follow-up: Return in about 1 year (around 06/05/2018).   Sherlene Shams, MD

## 2017-06-05 NOTE — Patient Instructions (Addendum)
YOUR GOAL WEIGHT FOR  BMI< 25 Is 152 LBS   AT 183  YOU ARE CONSIDERED  To be  OBESE (BMI of 30)   Health Maintenance for Postmenopausal Women Menopause is a normal process in which your reproductive ability comes to an end. This process happens gradually over a span of months to years, usually between the ages of 79 and 66. Menopause is complete when you have missed 12 consecutive menstrual periods. It is important to talk with your health care provider about some of the most common conditions that affect postmenopausal women, such as heart disease, cancer, and bone loss (osteoporosis). Adopting a healthy lifestyle and getting preventive care can help to promote your health and wellness. Those actions can also lower your chances of developing some of these common conditions. What should I know about menopause? During menopause, you may experience a number of symptoms, such as:  Moderate-to-severe hot flashes.  Night sweats.  Decrease in sex drive.  Mood swings.  Headaches.  Tiredness.  Irritability.  Memory problems.  Insomnia.  Choosing to treat or not to treat menopausal changes is an individual decision that you make with your health care provider. What should I know about hormone replacement therapy and supplements? Hormone therapy products are effective for treating symptoms that are associated with menopause, such as hot flashes and night sweats. Hormone replacement carries certain risks, especially as you become older. If you are thinking about using estrogen or estrogen with progestin treatments, discuss the benefits and risks with your health care provider. What should I know about heart disease and stroke? Heart disease, heart attack, and stroke become more likely as you age. This may be due, in part, to the hormonal changes that your body experiences during menopause. These can affect how your body processes dietary fats, triglycerides, and cholesterol. Heart attack and  stroke are both medical emergencies. There are many things that you can do to help prevent heart disease and stroke:  Have your blood pressure checked at least every 1-2 years. High blood pressure causes heart disease and increases the risk of stroke.  If you are 9-18 years old, ask your health care provider if you should take aspirin to prevent a heart attack or a stroke.  Do not use any tobacco products, including cigarettes, chewing tobacco, or electronic cigarettes. If you need help quitting, ask your health care provider.  It is important to eat a healthy diet and maintain a healthy weight. ? Be sure to include plenty of vegetables, fruits, low-fat dairy products, and lean protein. ? Avoid eating foods that are high in solid fats, added sugars, or salt (sodium).  Get regular exercise. This is one of the most important things that you can do for your health. ? Try to exercise for at least 150 minutes each week. The type of exercise that you do should increase your heart rate and make you sweat. This is known as moderate-intensity exercise. ? Try to do strengthening exercises at least twice each week. Do these in addition to the moderate-intensity exercise.  Know your numbers.Ask your health care provider to check your cholesterol and your blood glucose. Continue to have your blood tested as directed by your health care provider.  What should I know about cancer screening? There are several types of cancer. Take the following steps to reduce your risk and to catch any cancer development as early as possible. Breast Cancer  Practice breast self-awareness. ? This means understanding how your breasts normally appear  and feel. ? It also means doing regular breast self-exams. Let your health care provider know about any changes, no matter how small.  If you are 40 or older, have a clinician do a breast exam (clinical breast exam or CBE) every year. Depending on your age, family history,  and medical history, it may be recommended that you also have a yearly breast X-ray (mammogram).  If you have a family history of breast cancer, talk with your health care provider about genetic screening.  If you are at high risk for breast cancer, talk with your health care provider about having an MRI and a mammogram every year.  Breast cancer (BRCA) gene test is recommended for women who have family members with BRCA-related cancers. Results of the assessment will determine the need for genetic counseling and BRCA1 and for BRCA2 testing. BRCA-related cancers include these types: ? Breast. This occurs in males or females. ? Ovarian. ? Tubal. This may also be called fallopian tube cancer. ? Cancer of the abdominal or pelvic lining (peritoneal cancer). ? Prostate. ? Pancreatic.  Cervical, Uterine, and Ovarian Cancer Your health care provider may recommend that you be screened regularly for cancer of the pelvic organs. These include your ovaries, uterus, and vagina. This screening involves a pelvic exam, which includes checking for microscopic changes to the surface of your cervix (Pap test).  For women ages 21-65, health care providers may recommend a pelvic exam and a Pap test every three years. For women ages 31-65, they may recommend the Pap test and pelvic exam, combined with testing for human papilloma virus (HPV), every five years. Some types of HPV increase your risk of cervical cancer. Testing for HPV may also be done on women of any age who have unclear Pap test results.  Other health care providers may not recommend any screening for nonpregnant women who are considered low risk for pelvic cancer and have no symptoms. Ask your health care provider if a screening pelvic exam is right for you.  If you have had past treatment for cervical cancer or a condition that could lead to cancer, you need Pap tests and screening for cancer for at least 20 years after your treatment. If Pap tests  have been discontinued for you, your risk factors (such as having a new sexual partner) need to be reassessed to determine if you should start having screenings again. Some women have medical problems that increase the chance of getting cervical cancer. In these cases, your health care provider may recommend that you have screening and Pap tests more often.  If you have a family history of uterine cancer or ovarian cancer, talk with your health care provider about genetic screening.  If you have vaginal bleeding after reaching menopause, tell your health care provider.  There are currently no reliable tests available to screen for ovarian cancer.  Lung Cancer Lung cancer screening is recommended for adults 84-35 years old who are at high risk for lung cancer because of a history of smoking. A yearly low-dose CT scan of the lungs is recommended if you:  Currently smoke.  Have a history of at least 30 pack-years of smoking and you currently smoke or have quit within the past 15 years. A pack-year is smoking an average of one pack of cigarettes per day for one year.  Yearly screening should:  Continue until it has been 15 years since you quit.  Stop if you develop a health problem that would prevent you from  having lung cancer treatment.  Colorectal Cancer  This type of cancer can be detected and can often be prevented.  Routine colorectal cancer screening usually begins at age 63 and continues through age 46.  If you have risk factors for colon cancer, your health care provider may recommend that you be screened at an earlier age.  If you have a family history of colorectal cancer, talk with your health care provider about genetic screening.  Your health care provider may also recommend using home test kits to check for hidden blood in your stool.  A small camera at the end of a tube can be used to examine your colon directly (sigmoidoscopy or colonoscopy). This is done to check for  the earliest forms of colorectal cancer.  Direct examination of the colon should be repeated every 5-10 years until age 38. However, if early forms of precancerous polyps or small growths are found or if you have a family history or genetic risk for colorectal cancer, you may need to be screened more often.  Skin Cancer  Check your skin from head to toe regularly.  Monitor any moles. Be sure to tell your health care provider: ? About any new moles or changes in moles, especially if there is a change in a mole's shape or color. ? If you have a mole that is larger than the size of a pencil eraser.  If any of your family members has a history of skin cancer, especially at a young age, talk with your health care provider about genetic screening.  Always use sunscreen. Apply sunscreen liberally and repeatedly throughout the day.  Whenever you are outside, protect yourself by wearing long sleeves, pants, a wide-brimmed hat, and sunglasses.  What should I know about osteoporosis? Osteoporosis is a condition in which bone destruction happens more quickly than new bone creation. After menopause, you may be at an increased risk for osteoporosis. To help prevent osteoporosis or the bone fractures that can happen because of osteoporosis, the following is recommended:  If you are 74-88 years old, get at least 1,000 mg of calcium and at least 600 mg of vitamin D per day.  If you are older than age 26 but younger than age 87, get at least 1,200 mg of calcium and at least 600 mg of vitamin D per day.  If you are older than age 52, get at least 1,200 mg of calcium and at least 800 mg of vitamin D per day.  Smoking and excessive alcohol intake increase the risk of osteoporosis. Eat foods that are rich in calcium and vitamin D, and do weight-bearing exercises several times each week as directed by your health care provider. What should I know about how menopause affects my mental health? Depression may  occur at any age, but it is more common as you become older. Common symptoms of depression include:  Low or sad mood.  Changes in sleep patterns.  Changes in appetite or eating patterns.  Feeling an overall lack of motivation or enjoyment of activities that you previously enjoyed.  Frequent crying spells.  Talk with your health care provider if you think that you are experiencing depression. What should I know about immunizations? It is important that you get and maintain your immunizations. These include:  Tetanus, diphtheria, and pertussis (Tdap) booster vaccine.  Influenza every year before the flu season begins.  Pneumonia vaccine.  Shingles vaccine.  Your health care provider may also recommend other immunizations. This information is not  intended to replace advice given to you by your health care provider. Make sure you discuss any questions you have with your health care provider. Document Released: 10/17/2005 Document Revised: 03/14/2016 Document Reviewed: 05/29/2015 Elsevier Interactive Patient Education  2018 [employer].

## 2017-06-06 DIAGNOSIS — E663 Overweight: Secondary | ICD-10-CM | POA: Insufficient documentation

## 2017-06-06 LAB — COMPREHENSIVE METABOLIC PANEL
AG RATIO: 1.6 (calc) (ref 1.0–2.5)
ALT: 16 U/L (ref 6–29)
AST: 18 U/L (ref 10–35)
Albumin: 4.1 g/dL (ref 3.6–5.1)
Alkaline phosphatase (APISO): 42 U/L (ref 33–130)
BUN: 13 mg/dL (ref 7–25)
CHLORIDE: 104 mmol/L (ref 98–110)
CO2: 25 mmol/L (ref 20–32)
CREATININE: 0.7 mg/dL (ref 0.50–1.05)
Calcium: 9.2 mg/dL (ref 8.6–10.4)
GLOBULIN: 2.5 g/dL (ref 1.9–3.7)
GLUCOSE: 81 mg/dL (ref 65–99)
Potassium: 4.4 mmol/L (ref 3.5–5.3)
SODIUM: 138 mmol/L (ref 135–146)
Total Bilirubin: 0.3 mg/dL (ref 0.2–1.2)
Total Protein: 6.6 g/dL (ref 6.1–8.1)

## 2017-06-06 LAB — LDL CHOLESTEROL, DIRECT: Direct LDL: 85 mg/dL (ref <100)

## 2017-06-06 LAB — TSH: TSH: 1.58 m[IU]/L

## 2017-06-06 NOTE — Assessment & Plan Note (Signed)
I have addressed  BMI and recommended a low glycemic index diet utilizing smaller more frequent meals to increase metabolism.  I have also recommended that patient start exercising with a goal of 30 minutes of aerobic exercise a minimum of 5 days per week. Screening for lipid disorders, thyroid and diabetes to be done today.   

## 2017-06-06 NOTE — Assessment & Plan Note (Signed)
Annual comprehensive preventive exam was done as well as an evaluation and management of chronic conditions .  During the course of the visit the patient was educated and counseled about appropriate screening and preventive services including :  diabetes screening, lipid analysis with projected  10 year  risk for CAD , nutrition counseling, breast, cervical and colorectal cancer screening, and recommended immunizations.  Printed  Lab Results  Component Value Date   CHOL 188 05/23/2015   HDL 59.30 05/23/2015   LDLCALC 92 05/23/2015   LDLDIRECT 85 06/05/2017   TRIG 181.0 (H) 05/23/2015   CHOLHDL 3 05/23/2015    recommendations for health maintenance screenings was given

## 2017-06-07 ENCOUNTER — Encounter: Payer: Self-pay | Admitting: Internal Medicine

## 2017-06-08 ENCOUNTER — Ambulatory Visit
Admission: RE | Admit: 2017-06-08 | Discharge: 2017-06-08 | Disposition: A | Discharge status: Home / Self Care | Payer: 59 | Source: Ambulatory Visit | Attending: Internal Medicine | Admitting: Internal Medicine

## 2017-06-08 DIAGNOSIS — Z1231 Encounter for screening mammogram for malignant neoplasm of breast: Secondary | ICD-10-CM | POA: Insufficient documentation

## 2017-10-12 DIAGNOSIS — H5203 Hypermetropia, bilateral: Secondary | ICD-10-CM | POA: Diagnosis not present

## 2017-10-12 DIAGNOSIS — H524 Presbyopia: Secondary | ICD-10-CM | POA: Diagnosis not present

## 2017-12-18 ENCOUNTER — Encounter: Payer: Self-pay | Admitting: Dietician

## 2017-12-18 ENCOUNTER — Encounter: Payer: 59 | Attending: Internal Medicine | Admitting: Dietician

## 2017-12-18 VITALS — Ht 66.0 in | Wt 169.6 lb

## 2017-12-18 DIAGNOSIS — Z713 Dietary counseling and surveillance: Secondary | ICD-10-CM | POA: Diagnosis not present

## 2017-12-18 NOTE — Progress Notes (Signed)
Notes from [facility] employee "self referral" nutrition session: Start time: 1045   End time: 11:45  Met with employee to discuss his/her nutritional concerns and diet history. The employee's questions/concerns were also addressed.  Patient reports slow weight gain over the years, 2-5lbs per year. Has tried Weight [employer] at work about 21yr ago, regained weight she had lost. She reports living alone, and having difficulty making healthy choices due to challenge of cooking for one -- meals are usually quick, or takeout.  Dietary Intake: Breakfast: cereal or bagel with cream cheese light Snack: oatmeal raisin granola bar Lunch: sandwich when home on white wheat with beef and provolone or chicken salad, PBJ, or banana and mayo with baked chips Snack: 100-cal pack of cookies oreo or nutter butter Supper: sometimes out, when home cottage cheese or yogurt, or crackers due to late time after work shift.  Snack: usually none Beverages: water, diet Mt Dew  We discussed the following topics:  Healthy Eating  Weight Concerns   I also provided the following handouts as reinforcement of the educational session:  Planning a Balanced Meal with guide for 1400kcal daily intake  Sample menus    Plan:  set goals with direction from patient.   Next appt scheduled for 01/29/18

## 2017-12-18 NOTE — Patient Instructions (Signed)
   Begin tracking food intake to increase awareness of food choices and portions.  Aim for more balanced meals with some lean protein, controlled portions of carbohydrates, and plenty of "free" vegetables.   Continue with regular exercise.

## 2017-12-28 ENCOUNTER — Telehealth: Payer: Self-pay | Admitting: Dietician

## 2017-12-28 NOTE — Telephone Encounter (Signed)
Called patient to reschedule next appointment from 01/29/18 due to conflict. Rescheduled for 02/02/18.

## 2018-01-29 ENCOUNTER — Ambulatory Visit: Payer: 59 | Admitting: Dietician

## 2018-02-02 ENCOUNTER — Ambulatory Visit: Payer: 59 | Admitting: Dietician

## 2018-02-16 ENCOUNTER — Encounter: Payer: Self-pay | Admitting: Dietician

## 2018-02-16 ENCOUNTER — Encounter: Payer: 59 | Attending: Internal Medicine | Admitting: Dietician

## 2018-02-16 VITALS — Ht 66.0 in | Wt 170.1 lb

## 2018-02-16 DIAGNOSIS — Z713 Dietary counseling and surveillance: Secondary | ICD-10-CM | POA: Insufficient documentation

## 2018-02-16 DIAGNOSIS — Z6827 Body mass index (BMI) 27.0-27.9, adult: Secondary | ICD-10-CM | POA: Diagnosis not present

## 2018-02-16 NOTE — Progress Notes (Signed)
[facility] Employee "self referral" nutrition session: Start time: 1000   End time: 1030  Height: 5'6" Weight: 170.1lbs  Met with employee to follow up with her nutritional concerns and diet history.   Diet history: Patient reports she has stopped buying oreo and nutter butter cookies, and decreased intake of diet soda. Has increased fruit intake. She is working on healthy meal options.    Typical eating pattern: Breakfast: bagel with light cream cheese, or cereal Snack: oatmeal raisin granola bar Lunch: sandwich at home; when out salad or cooked meal with veg. (often [facility] cafeteria) Snack: single pack trail mix, or raisins or banana Supper: cheese and fruit, or crackers, or Premier Protein shake. Snack: none Beverages: water, some diet Mt Dew   Education topics covered during this visit:  General nutrition/ Healthy eating  Weight Concerns  Educational resources provided: None today  Additional Comments: Patient prefers to work on changes gradually, to promote better change in long-term habits.    Plan:  Patient will continue working on consuming balanced meals more consistently.  Follow-up scheduled for 03/30/18.

## 2018-03-30 ENCOUNTER — Encounter: Payer: 59 | Attending: Internal Medicine | Admitting: Dietician

## 2018-03-30 ENCOUNTER — Encounter: Payer: Self-pay | Admitting: Dietician

## 2018-03-30 VITALS — Ht 66.0 in | Wt 170.7 lb

## 2018-03-30 DIAGNOSIS — Z713 Dietary counseling and surveillance: Secondary | ICD-10-CM | POA: Diagnosis not present

## 2018-03-30 NOTE — Patient Instructions (Signed)
   Continue with healthy eating pattern, great job!  Gradually increase exercise as able.

## 2018-03-30 NOTE — Progress Notes (Signed)
[facility] Employee "self referral" nutrition session: Start time: 0930   End time: 7014  Height: 5'6" Weight: 170.7lbs  Met with employee to discuss his/her nutritional concerns and diet history.   Progress: Patient reports increase in menopausal symptoms in past several weeks including hot flashes, fatigue  Typical eating pattern: Breakfast: usually raisin bran, occasional bagel Snack: granola bar oatmeal raisin Lunch: sandwich, salad, or lean meat and veg Snack: trail mix Supper: tomato sandwiches or cottage cheese Snack: none Beverages: water, some Diet Mt Dew for caffeine  Physical activity: gardening, housework, some walking on the job. Has felt too tired to engage in additional exercise.   Education topics covered during this visit:  General nutrition/ Healthy eating  Exercise  Weight Concerns   Educational resources provided: None needed at this time  Additional Comments: Patient is taking measures to prevent extra eating at meals and snacks. She is eating balanced meals.    Plan:  Continue with current eating pattern and food choices. Gradually increase exercise as able.

## 2018-05-03 ENCOUNTER — Other Ambulatory Visit: Payer: Self-pay | Admitting: Internal Medicine

## 2018-05-03 DIAGNOSIS — Z1231 Encounter for screening mammogram for malignant neoplasm of breast: Secondary | ICD-10-CM

## 2018-06-10 ENCOUNTER — Encounter: Payer: Self-pay | Admitting: Internal Medicine

## 2018-06-10 ENCOUNTER — Ambulatory Visit
Admission: RE | Admit: 2018-06-10 | Discharge: 2018-06-10 | Disposition: A | Discharge status: Home / Self Care | Payer: 59 | Source: Ambulatory Visit | Attending: Internal Medicine | Admitting: Internal Medicine

## 2018-06-10 ENCOUNTER — Ambulatory Visit (INDEPENDENT_AMBULATORY_CARE_PROVIDER_SITE_OTHER): Payer: 59 | Admitting: Internal Medicine

## 2018-06-10 VITALS — BP 116/74 | HR 79 | Temp 98.1°F | Resp 14 | Ht 66.0 in | Wt 171.4 lb

## 2018-06-10 DIAGNOSIS — R5383 Other fatigue: Secondary | ICD-10-CM | POA: Diagnosis not present

## 2018-06-10 DIAGNOSIS — E663 Overweight: Secondary | ICD-10-CM | POA: Diagnosis not present

## 2018-06-10 DIAGNOSIS — Z1231 Encounter for screening mammogram for malignant neoplasm of breast: Secondary | ICD-10-CM | POA: Insufficient documentation

## 2018-06-10 DIAGNOSIS — E785 Hyperlipidemia, unspecified: Secondary | ICD-10-CM

## 2018-06-10 DIAGNOSIS — Z0000 Encounter for general adult medical examination without abnormal findings: Secondary | ICD-10-CM | POA: Diagnosis not present

## 2018-06-10 LAB — CBC WITH DIFFERENTIAL/PLATELET
BASOS ABS: 0.1 10*3/uL (ref 0.0–0.1)
Basophils Relative: 1 % (ref 0.0–3.0)
EOS PCT: 14 % — AB (ref 0.0–5.0)
Eosinophils Absolute: 0.9 10*3/uL — ABNORMAL HIGH (ref 0.0–0.7)
HCT: 40.1 % (ref 36.0–46.0)
Hemoglobin: 13.3 g/dL (ref 12.0–15.0)
LYMPHS ABS: 1.9 10*3/uL (ref 0.7–4.0)
Lymphocytes Relative: 28.7 % (ref 12.0–46.0)
MCHC: 33.3 g/dL (ref 30.0–36.0)
MCV: 90.5 fl (ref 78.0–100.0)
MONOS PCT: 8.3 % (ref 3.0–12.0)
Monocytes Absolute: 0.6 10*3/uL (ref 0.1–1.0)
NEUTROS ABS: 3.2 10*3/uL (ref 1.4–7.7)
NEUTROS PCT: 48 % (ref 43.0–77.0)
PLATELETS: 216 10*3/uL (ref 150.0–400.0)
RBC: 4.43 Mil/uL (ref 3.87–5.11)
RDW: 14.3 % (ref 11.5–15.5)
WBC: 6.8 10*3/uL (ref 4.0–10.5)

## 2018-06-10 LAB — COMPREHENSIVE METABOLIC PANEL
ALK PHOS: 49 U/L (ref 39–117)
ALT: 19 U/L (ref 0–35)
AST: 18 U/L (ref 0–37)
Albumin: 4 g/dL (ref 3.5–5.2)
BUN: 9 mg/dL (ref 6–23)
CO2: 32 mEq/L (ref 19–32)
Calcium: 9.4 mg/dL (ref 8.4–10.5)
Chloride: 103 mEq/L (ref 96–112)
Creatinine, Ser: 0.65 mg/dL (ref 0.40–1.20)
GFR: 100.01 mL/min (ref >60.00)
GLUCOSE: 138 mg/dL — AB (ref 70–99)
POTASSIUM: 3.5 meq/L (ref 3.5–5.1)
Sodium: 141 mEq/L (ref 135–145)
TOTAL PROTEIN: 7 g/dL (ref 6.0–8.3)
Total Bilirubin: 0.5 mg/dL (ref 0.2–1.2)

## 2018-06-10 LAB — TSH: TSH: 1.67 u[IU]/mL (ref 0.35–4.50)

## 2018-06-10 LAB — LDL CHOLESTEROL, DIRECT: Direct LDL: 95 mg/dL

## 2018-06-10 MED ORDER — ZOSTER VAC RECOMB ADJUVANTED 50 MCG/0.5ML IM SUSR: 0.5000 mL | Freq: Once | INTRAMUSCULAR | 1 refills | Status: AC

## 2018-06-10 NOTE — Patient Instructions (Addendum)
Take  20000 Ius of D3 daily from Nov to April    Health Maintenance for Postmenopausal Women Menopause is a normal process in which your reproductive ability comes to an end. This process happens gradually over a span of months to years, usually between the ages of 4 and 53. Menopause is complete when you have missed 12 consecutive menstrual periods. It is important to talk with your health care provider about some of the most common conditions that affect postmenopausal women, such as heart disease, cancer, and bone loss (osteoporosis). Adopting a healthy lifestyle and getting preventive care can help to promote your health and wellness. Those actions can also lower your chances of developing some of these common conditions. What should I know about menopause? During menopause, you may experience a number of symptoms, such as:  Moderate-to-severe hot flashes.  Night sweats.  Decrease in sex drive.  Mood swings.  Headaches.  Tiredness.  Irritability.  Memory problems.  Insomnia.  Choosing to treat or not to treat menopausal changes is an individual decision that you make with your health care provider. What should I know about hormone replacement therapy and supplements? Hormone therapy products are effective for treating symptoms that are associated with menopause, such as hot flashes and night sweats. Hormone replacement carries certain risks, especially as you become older. If you are thinking about using estrogen or estrogen with progestin treatments, discuss the benefits and risks with your health care provider. What should I know about heart disease and stroke? Heart disease, heart attack, and stroke become more likely as you age. This may be due, in part, to the hormonal changes that your body experiences during menopause. These can affect how your body processes dietary fats, triglycerides, and cholesterol. Heart attack and stroke are both medical emergencies. There are many  things that you can do to help prevent heart disease and stroke:  Have your blood pressure checked at least every 1-2 years. High blood pressure causes heart disease and increases the risk of stroke.  If you are 62-73 years old, ask your health care provider if you should take aspirin to prevent a heart attack or a stroke.  Do not use any tobacco products, including cigarettes, chewing tobacco, or electronic cigarettes. If you need help quitting, ask your health care provider.  It is important to eat a healthy diet and maintain a healthy weight. ? Be sure to include plenty of vegetables, fruits, low-fat dairy products, and lean protein. ? Avoid eating foods that are high in solid fats, added sugars, or salt (sodium).  Get regular exercise. This is one of the most important things that you can do for your health. ? Try to exercise for at least 150 minutes each week. The type of exercise that you do should increase your heart rate and make you sweat. This is known as moderate-intensity exercise. ? Try to do strengthening exercises at least twice each week. Do these in addition to the moderate-intensity exercise.  Know your numbers.Ask your health care provider to check your cholesterol and your blood glucose. Continue to have your blood tested as directed by your health care provider.  What should I know about cancer screening? There are several types of cancer. Take the following steps to reduce your risk and to catch any cancer development as early as possible. Breast Cancer  Practice breast self-awareness. ? This means understanding how your breasts normally appear and feel. ? It also means doing regular breast self-exams. Let your health care  provider know about any changes, no matter how small.  If you are 59 or older, have a clinician do a breast exam (clinical breast exam or CBE) every year. Depending on your age, family history, and medical history, it may be recommended that you  also have a yearly breast X-ray (mammogram).  If you have a family history of breast cancer, talk with your health care provider about genetic screening.  If you are at high risk for breast cancer, talk with your health care provider about having an MRI and a mammogram every year.  Breast cancer (BRCA) gene test is recommended for women who have family members with BRCA-related cancers. Results of the assessment will determine the need for genetic counseling and BRCA1 and for BRCA2 testing. BRCA-related cancers include these types: ? Breast. This occurs in males or females. ? Ovarian. ? Tubal. This may also be called fallopian tube cancer. ? Cancer of the abdominal or pelvic lining (peritoneal cancer). ? Prostate. ? Pancreatic.  Cervical, Uterine, and Ovarian Cancer Your health care provider may recommend that you be screened regularly for cancer of the pelvic organs. These include your ovaries, uterus, and vagina. This screening involves a pelvic exam, which includes checking for microscopic changes to the surface of your cervix (Pap test).  For women ages 21-65, health care providers may recommend a pelvic exam and a Pap test every three years. For women ages 27-65, they may recommend the Pap test and pelvic exam, combined with testing for human papilloma virus (HPV), every five years. Some types of HPV increase your risk of cervical cancer. Testing for HPV may also be done on women of any age who have unclear Pap test results.  Other health care providers may not recommend any screening for nonpregnant women who are considered low risk for pelvic cancer and have no symptoms. Ask your health care provider if a screening pelvic exam is right for you.  If you have had past treatment for cervical cancer or a condition that could lead to cancer, you need Pap tests and screening for cancer for at least 20 years after your treatment. If Pap tests have been discontinued for you, your risk factors  (such as having a new sexual partner) need to be reassessed to determine if you should start having screenings again. Some women have medical problems that increase the chance of getting cervical cancer. In these cases, your health care provider may recommend that you have screening and Pap tests more often.  If you have a family history of uterine cancer or ovarian cancer, talk with your health care provider about genetic screening.  If you have vaginal bleeding after reaching menopause, tell your health care provider.  There are currently no reliable tests available to screen for ovarian cancer.  Lung Cancer Lung cancer screening is recommended for adults 6-80 years old who are at high risk for lung cancer because of a history of smoking. A yearly low-dose CT scan of the lungs is recommended if you:  Currently smoke.  Have a history of at least 30 pack-years of smoking and you currently smoke or have quit within the past 15 years. A pack-year is smoking an average of one pack of cigarettes per day for one year.  Yearly screening should:  Continue until it has been 15 years since you quit.  Stop if you develop a health problem that would prevent you from having lung cancer treatment.  Colorectal Cancer  This type of cancer can be  detected and can often be prevented.  Routine colorectal cancer screening usually begins at age 14 and continues through age 34.  If you have risk factors for colon cancer, your health care provider may recommend that you be screened at an earlier age.  If you have a family history of colorectal cancer, talk with your health care provider about genetic screening.  Your health care provider may also recommend using home test kits to check for hidden blood in your stool.  A small camera at the end of a tube can be used to examine your colon directly (sigmoidoscopy or colonoscopy). This is done to check for the earliest forms of colorectal cancer.  Direct  examination of the colon should be repeated every 5-10 years until age 49. However, if early forms of precancerous polyps or small growths are found or if you have a family history or genetic risk for colorectal cancer, you may need to be screened more often.  Skin Cancer  Check your skin from head to toe regularly.  Monitor any moles. Be sure to tell your health care provider: ? About any new moles or changes in moles, especially if there is a change in a mole's shape or color. ? If you have a mole that is larger than the size of a pencil eraser.  If any of your family members has a history of skin cancer, especially at a young age, talk with your health care provider about genetic screening.  Always use sunscreen. Apply sunscreen liberally and repeatedly throughout the day.  Whenever you are outside, protect yourself by wearing long sleeves, pants, a wide-brimmed hat, and sunglasses.  What should I know about osteoporosis? Osteoporosis is a condition in which bone destruction happens more quickly than new bone creation. After menopause, you may be at an increased risk for osteoporosis. To help prevent osteoporosis or the bone fractures that can happen because of osteoporosis, the following is recommended:  If you are 14-82 years old, get at least 1,000 mg of calcium and at least 600 mg of vitamin D per day.  If you are older than age 21 but younger than age 38, get at least 1,200 mg of calcium and at least 600 mg of vitamin D per day.  If you are older than age 82, get at least 1,200 mg of calcium and at least 800 mg of vitamin D per day.  Smoking and excessive alcohol intake increase the risk of osteoporosis. Eat foods that are rich in calcium and vitamin D, and do weight-bearing exercises several times each week as directed by your health care provider. What should I know about how menopause affects my mental health? Depression may occur at any age, but it is more common as you become  older. Common symptoms of depression include:  Low or sad mood.  Changes in sleep patterns.  Changes in appetite or eating patterns.  Feeling an overall lack of motivation or enjoyment of activities that you previously enjoyed.  Frequent crying spells.  Talk with your health care provider if you think that you are experiencing depression. What should I know about immunizations? It is important that you get and maintain your immunizations. These include:  Tetanus, diphtheria, and pertussis (Tdap) booster vaccine.  Influenza every year before the flu season begins.  Pneumonia vaccine.  Shingles vaccine.  Your health care provider may also recommend other immunizations. This information is not intended to replace advice given to you by your health care provider. Make sure  you discuss any questions you have with your health care provider. Document Released: 10/17/2005 Document Revised: 03/14/2016 Document Reviewed: 05/29/2015 Elsevier Interactive Patient Education  2018 [employer].    To make a low carb chip :  Take the Joseph's Lavash or Pita bread,  Or the [place] Low carb whole wheat tortilla   Place on metal cookie sheet  Brush with olive oil  Sprinkle garlic powder (NOT garlic salt), grated parmesan cheese, mediterranean seasoning , or all of them?  Bake at 275 for 30 minutes   We have substitutions for your potatoes!!  Try the mashed cauliflower and riced cauliflower dishes instead of rice and mashed potatoes  Mashed turnips are also very low carb!   For desserts :  Try the Dannon Lt n Fit greek yogurt dessert flavors and top with reddi Whip .  8 carbs,  80 calories  Try Oikos Triple Zero [place] Yogurt in the salted caramel, and the coffee flavors  With Whipped Cream for dessert  breyer's low carb ice cream, available in bars (on a stick, better ) or scoopable ice cream  HERE ARE THE LOW CARB  BREAD CHOICES

## 2018-06-10 NOTE — Progress Notes (Signed)
Patient ID: Victoria Barrett, female    DOB: September 16, 1960  Age: 57 y.o. MRN: [id number]  The patient is here for annual preventive examination and management of other chronic and acute problems.    Up to date on colon ca,  Breast cancer screening  D Has had annual eye exam and biannual dental    The risk factors are reflected in the social history.  The roster of all physicians providing medical care to patient - is listed in the Snapshot section of the chart.  Activities of daily living:  The patient is 100% independent in all ADLs: dressing, toileting, feeding as well as independent mobility  Home safety : The patient has smoke detectors in the home. They wear seatbelts.  There are no firearms at home. There is no violence in the home.   There is no risks for hepatitis, STDs or HIV. There is no   history of blood transfusion. They have no travel history to infectious disease endemic areas of the world.  The patient has seen their dentist in the last six month. They have seen their eye doctor in the last year. They admit to slight hearing difficulty with regard to whispered voices and some television programs.  They have deferred audiologic testing in the last year.  They do not  have excessive sun exposure. Discussed the need for sun protection: hats, long sleeves and use of sunscreen if there is significant sun exposure.   Diet: the importance of a healthy diet is discussed. They do have a healthy diet.  The benefits of regular aerobic exercise were discussed. She walks 4 times per week ,  20 minutes.   Depression screen: there are no signs or vegative symptoms of depression- irritability, change in appetite, anhedonia, sadness/tearfullness.   The following portions of the patient's history were reviewed and updated as appropriate: allergies, current medications, past family history, past medical history,  past surgical history, past social history  and problem list.  Visual acuity was not  assessed per patient preference since she has regular follow up with her ophthalmologist. Hearing and body mass index were assessed and reviewed.   During the course of the visit the patient was educated and counseled about appropriate screening and preventive services including : fall prevention , diabetes screening, nutrition counseling, colorectal cancer screening, and recommended immunizations.    CC: The primary encounter diagnosis was Fatigue, unspecified type. Diagnoses of Hyperlipidemia LDL goal <130, Visit for preventive health examination, and Overweight (BMI 25.0-29.9) were also pertinent to this visit.  History Victoria Barrett has a past medical history of Chicken pox.   She has a past surgical history that includes Foot surgery (Right); salpingo ophect (Left); Ablation saphenous vein w/ RFA (Bilateral, 2014); and Colonoscopy with propofol (N/A, 05/24/2015).   Her family history includes Arthritis in her mother; Cancer in her paternal grandmother and sister; Cancer (age of onset: 44) in her maternal uncle; Cancer (age of onset: 41) in her father; Diabetes in her mother; Heart disease in her maternal grandfather, maternal grandmother, paternal grandfather, and paternal grandmother; Hypertension in her maternal grandmother and mother.She reports that she has never smoked. She has never used smokeless tobacco. She reports that she does not drink alcohol or use drugs.  Outpatient Medications Prior to Visit  Medication Sig Dispense Refill  . docusate sodium (COLACE) 250 MG capsule Take 250 mg by mouth daily.    . Multiple Vitamins-Minerals (MULTIVITAMIN WITH MINERALS) tablet Take 1 tablet by mouth daily.    Marland Kitchen  naproxen sodium (ANAPROX) 220 MG tablet Take 220 mg by mouth as needed.     . Nutritional Supplements (EQ ESTROBLEND MENOPAUSE PO) Take 1 tablet by mouth daily.    Marland Kitchen amoxicillin (AMOXIL) 500 MG capsule   0   No facility-administered medications prior to visit.     Review of Systems    Patient denies headache, fevers, malaise, unintentional weight loss, skin rash, eye pain, sinus congestion and sinus pain, sore throat, dysphagia,  hemoptysis , cough, dyspnea, wheezing, chest pain, palpitations, orthopnea, edema, abdominal pain, nausea, melena, diarrhea, constipation, flank pain, dysuria, hematuria, urinary  Frequency, nocturia, numbness, tingling, seizures,  Focal weakness, Loss of consciousness,  Tremor, insomnia, depression, anxiety, and suicidal ideation.      Objective:  BP 116/74 (BP Location: Left Arm, Patient Position: Sitting, Cuff Size: Normal)   Pulse 79   Temp 98.1 F (36.7 C) (Oral)   Resp 14   Ht 5\' 6"  (1.676 m)   Wt 171 lb 6.4 oz (77.7 kg)   SpO2 98%   BMI 27.66 kg/m   Physical Exam   General appearance: alert, cooperative and appears stated age Head: Normocephalic, without obvious abnormality, atraumatic Eyes: conjunctivae/corneas clear. PERRL, EOM's intact. Fundi benign. Ears: normal TM's and external ear canals both ears Nose: Nares normal. Septum midline. Mucosa normal. No drainage or sinus tenderness. Throat: lips, mucosa, and tongue normal; teeth and gums normal Neck: no adenopathy, no carotid bruit, no JVD, supple, symmetrical, trachea midline and thyroid not enlarged, symmetric, no tenderness/mass/nodules Lungs: clear to auscultation bilaterally Breasts: deferred by patient  Heart: regular rate and rhythm, S1, S2 normal, no murmur, click, rub or gallop Abdomen: soft, non-tender; bowel sounds normal; no masses,  no organomegaly Extremities: extremities normal, atraumatic, no cyanosis or edema Pulses: 2+ and symmetric Skin: Skin color, texture, turgor normal. No rashes or lesions Neurologic: Alert and oriented X 3, normal strength and tone. Normal symmetric reflexes. Normal coordination and gait.      Assessment & Plan:   Problem List Items Addressed This Visit    Overweight (BMI 25.0-29.9)    I have addressed  BMI and recommended wt  loss of 10% of body weigh over the next 6 months using a low glycemic index diet and regular exercise a minimum of 5 days per week.        Visit for preventive health examination    Annual comprehensive preventive exam was done as well as an evaluation and management of chronic conditions .  During the course of the visit the patient was educated and counseled about appropriate screening and preventive services including :  diabetes screening, lipid analysis with projected  10 year  risk for CAD , nutrition counseling, breast, cervical and colorectal cancer screening, and recommended immunizations.  Printed recommendations for health maintenance screenings was given.  Lab Results  Component Value Date   CHOL 188 05/23/2015   HDL 59.30 05/23/2015   LDLCALC 92 05/23/2015   LDLDIRECT 95.0 06/10/2018   TRIG 181.0 (H) 05/23/2015   CHOLHDL 3 05/23/2015   Lab Results  Component Value Date   TSH 1.67 06/10/2018   Lab Results  Component Value Date   CREATININE 0.65 06/10/2018   Lab Results  Component Value Date   NA 141 06/10/2018   K 3.5 06/10/2018   CL 103 06/10/2018   CO2 32 06/10/2018   Lab Results  Component Value Date   ALT 19 06/10/2018   AST 18 06/10/2018   ALKPHOS 49 06/10/2018  BILITOT 0.5 06/10/2018   Lab Results  Component Value Date   WBC 6.8 06/10/2018   HGB 13.3 06/10/2018   HCT 40.1 06/10/2018   MCV 90.5 06/10/2018   PLT 216.0 06/10/2018          Other Visit Diagnoses    Fatigue, unspecified type    -  Primary   Relevant Orders   Comprehensive metabolic panel (Completed)   TSH (Completed)   CBC with Differential/Platelet (Completed)   Hyperlipidemia LDL goal <130       Relevant Orders   LDL cholesterol, direct (Completed)      I have discontinued Victoria Barrett's amoxicillin. I am also having her start on Zoster Vaccine Adjuvanted. Additionally, I am having her maintain her multivitamin with minerals, docusate sodium, naproxen sodium, and  Nutritional Supplements (EQ ESTROBLEND MENOPAUSE PO).  Meds ordered this encounter  Medications  . Zoster Vaccine Adjuvanted [facility]) injection    Sig: Inject 0.5 mLs into the muscle once for 1 dose.    Dispense:  1 each    Refill:  1    Medications Discontinued During This Encounter  Medication Reason  . amoxicillin (AMOXIL) 500 MG capsule Completed Course    Follow-up: Return in about 1 year (around 06/11/2019).   Sherlene Shams, MD

## 2018-06-12 NOTE — Assessment & Plan Note (Signed)
I have addressed  BMI and recommended wt loss of 10% of body weigh over the next 6 months using a low glycemic index diet and regular exercise a minimum of 5 days per week.   

## 2018-06-12 NOTE — Assessment & Plan Note (Addendum)
Annual comprehensive preventive exam was done as well as an evaluation and management of chronic conditions .  During the course of the visit the patient was educated and counseled about appropriate screening and preventive services including :  diabetes screening, lipid analysis with projected  10 year  risk for CAD , nutrition counseling, breast, cervical and colorectal cancer screening, and recommended immunizations.  Printed recommendations for health maintenance screenings was given.  Lab Results  Component Value Date   CHOL 188 05/23/2015   HDL 59.30 05/23/2015   LDLCALC 92 05/23/2015   LDLDIRECT 95.0 06/10/2018   TRIG 181.0 (H) 05/23/2015   CHOLHDL 3 05/23/2015   Lab Results  Component Value Date   TSH 1.67 06/10/2018   Lab Results  Component Value Date   CREATININE 0.65 06/10/2018   Lab Results  Component Value Date   NA 141 06/10/2018   K 3.5 06/10/2018   CL 103 06/10/2018   CO2 32 06/10/2018   Lab Results  Component Value Date   ALT 19 06/10/2018   AST 18 06/10/2018   ALKPHOS 49 06/10/2018   BILITOT 0.5 06/10/2018   Lab Results  Component Value Date   WBC 6.8 06/10/2018   HGB 13.3 06/10/2018   HCT 40.1 06/10/2018   MCV 90.5 06/10/2018   PLT 216.0 06/10/2018

## 2019-04-28 ENCOUNTER — Other Ambulatory Visit: Payer: Self-pay | Admitting: Internal Medicine

## 2019-04-28 DIAGNOSIS — Z1231 Encounter for screening mammogram for malignant neoplasm of breast: Secondary | ICD-10-CM

## 2019-05-24 ENCOUNTER — Encounter: Payer: Self-pay | Admitting: Internal Medicine

## 2019-06-13 ENCOUNTER — Encounter: Payer: 59 | Admitting: Internal Medicine

## 2019-07-05 ENCOUNTER — Encounter: Payer: 59 | Admitting: Internal Medicine

## 2019-09-16 ENCOUNTER — Encounter: Payer: 59 | Admitting: Internal Medicine

## 2019-12-14 ENCOUNTER — Other Ambulatory Visit: Payer: Self-pay

## 2019-12-14 ENCOUNTER — Encounter: Payer: Self-pay | Admitting: Internal Medicine

## 2019-12-14 ENCOUNTER — Other Ambulatory Visit (HOSPITAL_COMMUNITY)
Admission: RE | Admit: 2019-12-14 | Discharge: 2019-12-14 | Disposition: A | Discharge status: Home / Self Care | Payer: No Typology Code available for payment source | Source: Ambulatory Visit | Attending: Internal Medicine | Admitting: Internal Medicine

## 2019-12-14 ENCOUNTER — Ambulatory Visit (INDEPENDENT_AMBULATORY_CARE_PROVIDER_SITE_OTHER): Payer: No Typology Code available for payment source | Admitting: Internal Medicine

## 2019-12-14 ENCOUNTER — Ambulatory Visit
Admission: RE | Admit: 2019-12-14 | Discharge: 2019-12-14 | Disposition: A | Discharge status: Home / Self Care | Payer: No Typology Code available for payment source | Source: Ambulatory Visit | Attending: Internal Medicine | Admitting: Internal Medicine

## 2019-12-14 VITALS — BP 126/70 | HR 79 | Temp 96.8°F | Resp 14 | Ht 66.0 in | Wt 178.8 lb

## 2019-12-14 DIAGNOSIS — R635 Abnormal weight gain: Secondary | ICD-10-CM

## 2019-12-14 DIAGNOSIS — Z20822 Contact with and (suspected) exposure to covid-19: Secondary | ICD-10-CM

## 2019-12-14 DIAGNOSIS — Z0000 Encounter for general adult medical examination without abnormal findings: Secondary | ICD-10-CM

## 2019-12-14 DIAGNOSIS — Z1231 Encounter for screening mammogram for malignant neoplasm of breast: Secondary | ICD-10-CM

## 2019-12-14 DIAGNOSIS — Z124 Encounter for screening for malignant neoplasm of cervix: Secondary | ICD-10-CM

## 2019-12-14 DIAGNOSIS — E559 Vitamin D deficiency, unspecified: Secondary | ICD-10-CM | POA: Insufficient documentation

## 2019-12-14 DIAGNOSIS — K5904 Chronic idiopathic constipation: Secondary | ICD-10-CM

## 2019-12-14 LAB — LIPID PANEL
Cholesterol: 197 mg/dL (ref 0–200)
HDL: 51 mg/dL (ref >39.00)
NonHDL: 146.3
Total CHOL/HDL Ratio: 4
Triglycerides: 214 mg/dL — ABNORMAL HIGH (ref 0.0–149.0)
VLDL: 42.8 mg/dL — ABNORMAL HIGH (ref 0.0–40.0)

## 2019-12-14 LAB — COMPREHENSIVE METABOLIC PANEL
ALT: 19 U/L (ref 0–35)
AST: 16 U/L (ref 0–37)
Albumin: 4.2 g/dL (ref 3.5–5.2)
Alkaline Phosphatase: 60 U/L (ref 39–117)
BUN: 19 mg/dL (ref 6–23)
CO2: 32 mEq/L (ref 19–32)
Calcium: 9.7 mg/dL (ref 8.4–10.5)
Chloride: 104 mEq/L (ref 96–112)
Creatinine, Ser: 0.73 mg/dL (ref 0.40–1.20)
GFR: 81.86 mL/min (ref >60.00)
Glucose, Bld: 123 mg/dL — ABNORMAL HIGH (ref 70–99)
Potassium: 3.7 mEq/L (ref 3.5–5.1)
Sodium: 141 mEq/L (ref 135–145)
Total Bilirubin: 0.3 mg/dL (ref 0.2–1.2)
Total Protein: 6.9 g/dL (ref 6.0–8.3)

## 2019-12-14 LAB — VITAMIN D 25 HYDROXY (VIT D DEFICIENCY, FRACTURES): VITD: 58.18 ng/mL (ref 30.00–100.00)

## 2019-12-14 LAB — TSH: TSH: 2.17 u[IU]/mL (ref 0.35–4.50)

## 2019-12-14 LAB — LDL CHOLESTEROL, DIRECT: Direct LDL: 117 mg/dL

## 2019-12-14 LAB — HEMOGLOBIN A1C: Hgb A1c MFr Bld: 5.6 % (ref 4.6–6.5)

## 2019-12-14 NOTE — Assessment & Plan Note (Signed)
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  Managed with Colace 250 mg

## 2019-12-14 NOTE — Assessment & Plan Note (Signed)
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  She is asymptomatic

## 2019-12-14 NOTE — Progress Notes (Signed)
Patient ID: Victoria Barrett, female    DOB: 1960-09-09  Age: 59 y.o. MRN: [id number]  The patient is here for  wellness examination and management of other chronic and acute problems.  This visit occurred during the SARS-CoV-2 public health emergency.  Safety protocols were in place, including screening questions prior to the visit, additional usage of staff PPE, and extensive cleaning of exam room while observing appropriate contact time as indicated for disinfecting solutions.    Patient has NOT receivedT he available COVID 19 vaccine.  Patient continues to mask when outside of the home except when walking in yard or at safe distances from others .  Patient denies any change in mood or development of unhealthy behaviors resuting from the pandemic's restriction of activities and socialization.       The risk factors are reflected in the social history.  The roster of all physicians providing medical care to patient - is listed in the Snapshot section of the chart.  Activities of daily living:  The patient is 100% independent in all ADLs: dressing, toileting, feeding as well as independent mobility  Home safety : The patient has smoke detectors in the home. They wear seatbelts.  There are no firearms at home. There is no violence in the home.   There is no risks for hepatitis, STDs or HIV. There is no   history of blood transfusion. They have no travel history to infectious disease endemic areas of the world.  The patient has seen their dentist in the last six month. They have seen their eye doctor in the last year. TheyDENY slight hearing difficulty with regard to whispered voices and some television programs.  They have deferred audiologic testing in the last year.  They do not  have excessive sun exposure. Discussed the need for sun protection: hats, long sleeves and use of sunscreen if there is significant sun exposure.   Diet: the importance of a healthy diet is discussed. They do have a  healthy diet.  The benefits of regular aerobic exercise were discussed. She walks 4 times per week ,  20 minutes.   Depression screen: there are no signs or vegative symptoms of depression- irritability, change in appetite, anhedonia, sadness/tearfullness.  The following portions of the patient's history were reviewed and updated as appropriate: allergies, current medications, past family history, past medical history,  past surgical history, past social history  and problem list.  Visual acuity was not assessed per patient preference since she has regular follow up with her ophthalmologist. Hearing and body mass index were assessed and reviewed.   During the course of the visit the patient was educated and counseled about appropriate screening and preventive services including : fall prevention , diabetes screening, nutrition counseling, colorectal cancer screening, and recommended immunizations.    CC: The primary encounter diagnosis was Cervical cancer screening. Diagnoses of Weight gain, Vitamin D deficiency, Encounter for screening laboratory testing for COVID-19 virus in asymptomatic patient, Functional constipation, and Visit for preventive health examination were also pertinent to this visit.  History Rei has a past medical history of Chicken pox.   She has a past surgical history that includes Foot surgery (Right); salpingo ophect (Left); Ablation saphenous vein w/ RFA (Bilateral, 2014); and Colonoscopy with propofol (N/A, 05/24/2015).   Her family history includes Arthritis in her mother; Cancer in her paternal grandmother and sister; Cancer (age of onset: 42) in her maternal uncle; Cancer (age of onset: 58) in her father; Diabetes in her mother;  Heart disease in her maternal grandfather, maternal grandmother, paternal grandfather, and paternal grandmother; Hypertension in her maternal grandmother and mother.She reports that she has never smoked. She has never used smokeless tobacco.  She reports that she does not drink alcohol or use drugs.  Outpatient Medications Prior to Visit  Medication Sig Dispense Refill  . docusate sodium (COLACE) 250 MG capsule Take 250 mg by mouth daily.    Marland Kitchen loratadine (CLARITIN) 10 MG tablet Take 10 mg by mouth daily.    . Multiple Vitamins-Minerals (MULTIVITAMIN WITH MINERALS) tablet Take 1 tablet by mouth daily.    . naproxen sodium (ANAPROX) 220 MG tablet Take 220 mg by mouth as needed.     . Nutritional Supplements (EQ ESTROBLEND MENOPAUSE PO) Take 1 tablet by mouth daily.     No facility-administered medications prior to visit.    Review of Systems   Patient denies headache, fevers, malaise, unintentional weight loss, skin rash, eye pain, sinus congestion and sinus pain, sore throat, dysphagia,  hemoptysis , cough, dyspnea, wheezing, chest pain, palpitations, orthopnea, edema, abdominal pain, nausea, melena, diarrhea, constipation, flank pain, dysuria, hematuria, urinary  Frequency, nocturia, numbness, tingling, seizures,  Focal weakness, Loss of consciousness,  Tremor, insomnia, depression, anxiety, and suicidal ideation.      Objective:  BP 126/70 (BP Location: Left Arm, Patient Position: Sitting, Cuff Size: Normal)   Pulse 79   Temp (!) 96.8 F (36 C) (Temporal)   Resp 14   Ht 5\' 6"  (1.676 m)   Wt 178 lb 12.8 oz (81.1 kg)   SpO2 99%   BMI 28.86 kg/m   Physical Exam  . General Appearance:    Alert, cooperative, no distress, appears stated age  Head:    Normocephalic, without obvious abnormality, atraumatic  Eyes:    PERRL, conjunctiva/corneas clear, EOM's intact, fundi    benign, both eyes  Ears:    Normal TM's and external ear canals, both ears  Nose:   Nares normal, septum midline, mucosa normal, no drainage    or sinus tenderness  Throat:   Lips, mucosa, and tongue normal; teeth and gums normal  Neck:   Supple, symmetrical, trachea midline, no adenopathy;    thyroid:  no enlargement/tenderness/nodules; no carotid    bruit or JVD  Back:     Symmetric, no curvature, ROM normal, no CVA tenderness  Lungs:     Clear to auscultation bilaterally, respirations unlabored  Chest Wall:    No tenderness or deformity   Heart:    Regular rate and rhythm, S1 and S2 normal, no murmur, rub   or gallop  Breast Exam:    No tenderness, masses, or nipple abnormality  Abdomen:     Soft, non-tender, bowel sounds active all four quadrants,    no masses, no organomegaly  Genitalia:    Pelvic: cervix normal in appearance, external genitalia normal, no adnexal masses or tenderness, no cervical motion tenderness, rectovaginal septum normal, uterus normal size, shape, and consistency and vagina normal without discharge  Extremities:   Extremities normal, atraumatic, no cyanosis or edema  Pulses:   2+ and symmetric all extremities  Skin:   Skin color, texture, turgor normal, no rashes or lesions  Lymph nodes:   Cervical, supraclavicular, and axillary nodes normal  Neurologic:   CNII-XII intact, normal strength, sensation and reflexes    throughout    Assessment & Plan:   Problem List Items Addressed This Visit      Unprioritized   Visit for preventive health  examination    age appropriate education and counseling updated, referrals for preventative services and immunizations addressed, dietary and smoking counseling addressed, most recent labs reviewed.  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  She is asymptomatic       Relevant Orders   SAR CoV2 Serology (COVID 19)AB(IGG)IA   Weight gain    I have addressed  BMI and recommended a low glycemic index diet utilizing smaller more frequent meals to increase metabolism.  I have also recommended that patient start exercising with a goal of 30 minutes of aerobic exercise a minimum of 5 days per week. Screening for lipid disorders, thyroid and diabetes to be done today.        Relevant Orders   Comprehensive metabolic panel   Lipid panel   TSH   Hemoglobin A1c   Vitamin D deficiency   Relevant Orders   VITAMIN D 25 Hydroxy (Vit-D Deficiency, Fractures)    Other Visit Diagnoses    Cervical cancer screening    -  Primary   Relevant Orders   Cytology - PAP      I am having Cammie L. Hayduk maintain her multivitamin with minerals, docusate sodium, naproxen sodium, Nutritional Supplements (EQ ESTROBLEND MENOPAUSE PO), and loratadine.  No orders of the defined types were placed in this encounter.   There are no discontinued medications.  Follow-up: No follow-ups on file.   Crecencio Mc, MD

## 2019-12-14 NOTE — Patient Instructions (Signed)

## 2019-12-14 NOTE — Assessment & Plan Note (Signed)
I have addressed  BMI and recommended a low glycemic index diet utilizing smaller more frequent meals to increase metabolism.  I have also recommended that patient start exercising with a goal of 30 minutes of aerobic exercise a minimum of 5 days per week. Screening for lipid disorders, thyroid and diabetes to be done today.   

## 2019-12-14 NOTE — Assessment & Plan Note (Signed)

## 2019-12-15 LAB — CYTOLOGY - PAP
Comment: NEGATIVE
Diagnosis: NEGATIVE
High risk HPV: NEGATIVE

## 2019-12-15 LAB — SARS-COV-2 ANTIBODY(IGG)SPIKE,SEMI-QUANTITATIVE: SARS COV1 AB(IGG)SPIKE,SEMI QN: <1 index (ref <1.00)

## 2020-11-01 ENCOUNTER — Other Ambulatory Visit: Payer: Self-pay | Admitting: Internal Medicine

## 2020-11-01 DIAGNOSIS — Z1231 Encounter for screening mammogram for malignant neoplasm of breast: Secondary | ICD-10-CM

## 2020-12-17 ENCOUNTER — Other Ambulatory Visit: Payer: Self-pay

## 2020-12-17 ENCOUNTER — Encounter: Payer: Self-pay | Admitting: Internal Medicine

## 2020-12-17 ENCOUNTER — Ambulatory Visit (INDEPENDENT_AMBULATORY_CARE_PROVIDER_SITE_OTHER): Payer: No Typology Code available for payment source | Admitting: Internal Medicine

## 2020-12-17 ENCOUNTER — Ambulatory Visit
Admission: RE | Admit: 2020-12-17 | Discharge: 2020-12-17 | Disposition: A | Discharge status: Home / Self Care | Payer: No Typology Code available for payment source | Source: Ambulatory Visit | Attending: Internal Medicine | Admitting: Internal Medicine

## 2020-12-17 VITALS — BP 118/66 | HR 76 | Temp 98.6°F | Resp 15 | Ht 66.0 in | Wt 173.6 lb

## 2020-12-17 DIAGNOSIS — Z1211 Encounter for screening for malignant neoplasm of colon: Secondary | ICD-10-CM

## 2020-12-17 DIAGNOSIS — M21612 Bunion of left foot: Secondary | ICD-10-CM

## 2020-12-17 DIAGNOSIS — R7301 Impaired fasting glucose: Secondary | ICD-10-CM

## 2020-12-17 DIAGNOSIS — Z1231 Encounter for screening mammogram for malignant neoplasm of breast: Secondary | ICD-10-CM | POA: Insufficient documentation

## 2020-12-17 DIAGNOSIS — M21611 Bunion of right foot: Secondary | ICD-10-CM | POA: Insufficient documentation

## 2020-12-17 DIAGNOSIS — Z0000 Encounter for general adult medical examination without abnormal findings: Secondary | ICD-10-CM

## 2020-12-17 DIAGNOSIS — E663 Overweight: Secondary | ICD-10-CM

## 2020-12-17 DIAGNOSIS — R635 Abnormal weight gain: Secondary | ICD-10-CM

## 2020-12-17 LAB — COMPREHENSIVE METABOLIC PANEL
ALT: 35 U/L (ref 0–35)
AST: 30 U/L (ref 0–37)
Albumin: 4.2 g/dL (ref 3.5–5.2)
Alkaline Phosphatase: 54 U/L (ref 39–117)
BUN: 9 mg/dL (ref 6–23)
CO2: 31 mEq/L (ref 19–32)
Calcium: 10.3 mg/dL (ref 8.4–10.5)
Chloride: 105 mEq/L (ref 96–112)
Creatinine, Ser: 0.68 mg/dL (ref 0.40–1.20)
GFR: 95.55 mL/min (ref >60.00)
Glucose, Bld: 91 mg/dL (ref 70–99)
Potassium: 4 mEq/L (ref 3.5–5.1)
Sodium: 143 mEq/L (ref 135–145)
Total Bilirubin: 0.4 mg/dL (ref 0.2–1.2)
Total Protein: 7 g/dL (ref 6.0–8.3)

## 2020-12-17 LAB — LIPID PANEL
Cholesterol: 145 mg/dL (ref 0–200)
HDL: 40.3 mg/dL (ref >39.00)
LDL Cholesterol: 75 mg/dL (ref 0–99)
NonHDL: 104.72
Total CHOL/HDL Ratio: 4
Triglycerides: 150 mg/dL — ABNORMAL HIGH (ref 0.0–149.0)
VLDL: 30 mg/dL (ref 0.0–40.0)

## 2020-12-17 LAB — HEMOGLOBIN A1C: Hgb A1c MFr Bld: 5.8 % (ref 4.6–6.5)

## 2020-12-17 LAB — TSH: TSH: 1.91 u[IU]/mL (ref 0.35–4.50)

## 2020-12-17 MED ORDER — TETANUS-DIPHTH-ACELL PERTUSSIS 5-2.5-18.5 LF-MCG/0.5 IM SUSY: 0.5000 mL | PREFILLED_SYRINGE | Freq: Once | INTRAMUSCULAR | 0 refills | Status: AC

## 2020-12-17 MED ORDER — ZOSTER VAC RECOMB ADJUVANTED 50 MCG/0.5ML IM SUSR: 0.5000 mL | Freq: Once | INTRAMUSCULAR | 1 refills | Status: AC

## 2020-12-17 NOTE — Progress Notes (Signed)
Patient ID: ABRIE EGLOFF, female    DOB: 06-26-1961  Age: 60 y.o. MRN: [id number]  The patient is here for annual  non gyn  examination and management of other chronic and acute problems.   The risk factors are reflected in the social history.  The roster of all physicians providing medical care to patient - is listed in the Snapshot section of the chart.  Activities of daily living:  The patient is 100% independent in all ADLs: dressing, toileting, feeding as well as independent mobility  Home safety : The patient has smoke detectors in the home. They wear seatbelts.  There are no firearms at home. There is no violence in the home.   There is no risks for hepatitis, STDs or HIV. There is no   history of blood transfusion. They have no travel history to infectious disease endemic areas of the world.  The patient has seen their dentist in the last six month. They have seen their eye doctor in the last year. She denies hearing difficulty and has deferred audiologic testing in the last year.  They do not  have excessive sun exposure. Discussed the need for sun protection: hats, long sleeves and use of sunscreen if there is significant sun exposure.   Diet: the importance of a healthy diet is discussed. They do have a healthy diet.  The benefits of regular aerobic exercise were discussed. She walks 4 times per week ,  20 minutes.   Depression screen: there are no signs or vegative symptoms of depression- irritability, change in appetite, anhedonia, sadness/tearfullness.   The following portions of the patient's history were reviewed and updated as appropriate: allergies, current medications, past family history, past medical history,  past surgical history, past social history  and problem list.  Visual acuity was not assessed per patient preference since she has regular follow up with her ophthalmologist. Hearing and body mass index were assessed and reviewed.   During the course of the visit  the patient was educated and counseled about appropriate screening and preventive services including : fall prevention , diabetes screening, nutrition counseling, colorectal cancer screening, and recommended immunizations.    CC: The primary encounter diagnosis was Visit for preventive health examination. Diagnoses of Bunion of great toe of left foot, Colon cancer screening, Impaired fasting glucose, Weight gain, Overweight (BMI 25.0-29.9), and Bilateral bunions were also pertinent to this visit.  History Zanaria has a past medical history of Chicken pox.   She has a past surgical history that includes Foot surgery (Right); salpingo ophect (Left); Ablation saphenous vein w/ RFA (Bilateral, 2014); and Colonoscopy with propofol (N/A, 05/24/2015).   Her family history includes Arthritis in her mother; Cancer in her paternal grandmother and sister; Cancer (age of onset: 80) in her maternal uncle; Cancer (age of onset: 54) in her father; Diabetes in her mother; Heart disease in her maternal grandfather, maternal grandmother, paternal grandfather, and paternal grandmother; Hypertension in her maternal grandmother and mother.She reports that she has never smoked. She has never used smokeless tobacco. She reports that she does not drink alcohol and does not use drugs.  Outpatient Medications Prior to Visit  Medication Sig Dispense Refill  . docusate sodium (COLACE) 250 MG capsule Take 250 mg by mouth daily.    Marland Kitchen loratadine (CLARITIN) 10 MG tablet Take 10 mg by mouth daily as needed.    . Multiple Vitamins-Minerals (MULTIVITAMIN WITH MINERALS) tablet Take 1 tablet by mouth daily.    . naproxen sodium (ANAPROX)  220 MG tablet Take 220 mg by mouth as needed.     . Nutritional Supplements (EQ ESTROBLEND MENOPAUSE PO) Take 1 tablet by mouth daily.     No facility-administered medications prior to visit.    Review of Systems   Patient denies headache, fevers, malaise, unintentional weight loss, skin rash, eye  pain, sinus congestion and sinus pain, sore throat, dysphagia,  hemoptysis , cough, dyspnea, wheezing, chest pain, palpitations, orthopnea, edema, abdominal pain, nausea, melena, diarrhea, constipation, flank pain, dysuria, hematuria, urinary  Frequency, nocturia, numbness, tingling, seizures,  Focal weakness, Loss of consciousness,  Tremor, insomnia, depression, anxiety, and suicidal ideation.      Objective:  BP 118/66 (BP Location: Left Arm, Patient Position: Sitting, Cuff Size: Normal)   Pulse 76   Temp 98.6 F (37 C) (Oral)   Resp 15   Ht 5\' 6"  (1.676 m)   Wt 173 lb 9.6 oz (78.7 kg)   LMP 05/27/2017   SpO2 99%   BMI 28.02 kg/m   Physical Exam  General appearance: alert, cooperative and appears stated age Head: Normocephalic, without obvious abnormality, atraumatic Eyes: conjunctivae/corneas clear. PERRL, EOM's intact. Fundi benign. Ears: normal TM's and external ear canals both ears Nose: Nares normal. Septum midline. Mucosa normal. No drainage or sinus tenderness. Throat: lips, mucosa, and tongue normal; teeth and gums normal Neck: no adenopathy, no carotid bruit, no JVD, supple, symmetrical, trachea midline and thyroid not enlarged, symmetric, no tenderness/mass/nodules Lungs: clear to auscultation bilaterally Breasts: normal appearance, no masses or tenderness Heart: regular rate and rhythm, S1, S2 normal, no murmur, click, rub or gallop Abdomen: soft, non-tender; bowel sounds normal; no masses,  no organomegaly Extremities: extremities normal, atraumatic, no cyanosis or edema Pulses: 2+ and symmetric Skin: Skin color, texture, turgor normal. No rashes or lesions Neurologic: Alert and oriented X 3, normal strength and tone. Normal symmetric reflexes. Normal coordination and gait.    Assessment & Plan:   Problem List Items Addressed This Visit      Unprioritized   Bilateral bunions    She has requested referral to Podiatry for surgical correction       Overweight  (BMI 25.0-29.9)    I have addressed  BMI and recommended a low glycemic index diet utilizing smaller more frequent meals to increase metabolism.  I have also recommended that patient start exercising with a goal of 30 minutes of aerobic exercise a minimum of 5 days per week. Screening for lipid disorders, thyroid and diabetes to be done today.  Lab Results  Component Value Date   TSH 1.91 12/17/2020   Lab Results  Component Value Date   CHOL 145 12/17/2020   HDL 40.30 12/17/2020   LDLCALC 75 12/17/2020   LDLDIRECT 117.0 12/14/2019   TRIG 150.0 (H) 12/17/2020   CHOLHDL 4 12/17/2020   Lab Results  Component Value Date   HGBA1C 5.8 12/17/2020           Visit for preventive health examination - Primary    age appropriate education and counseling updated, referrals for preventative services and immunizations addressed, dietary and smoking counseling addressed, most recent labs reviewed.  I have personally reviewed and have noted:  1) the patient's medical and social history 2) The pt's use of alcohol, tobacco, and illicit drugs 3) The patient's current medications and supplements 4) Functional ability including ADL's, fall risk, home safety risk, hearing and visual impairment 5) Diet and physical activities 6) Evidence for depression or mood disorder 7) The patient's height, weight,  and BMI have been recorded in the chart  I have made referrals, and provided counseling and education based on review of the above      Weight gain   Relevant Orders   Lipid panel (Completed)   TSH (Completed)    Other Visit Diagnoses    Bunion of great toe of left foot       Relevant Orders   Ambulatory referral to Podiatry   Colon cancer screening       Relevant Orders   Ambulatory referral to Gastroenterology   Impaired fasting glucose       Relevant Orders   Comprehensive metabolic panel (Completed)   Hemoglobin A1c (Completed)      I have discontinued Dannica L. Horine's  Nutritional Supplements (EQ ESTROBLEND MENOPAUSE PO). I am also having her start on Tdap and Zoster Vaccine Adjuvanted. Additionally, I am having her maintain her multivitamin with minerals, docusate sodium, naproxen sodium, and loratadine.  Meds ordered this encounter  Medications  . Tdap (BOOSTRIX) 5-2.5-18.5 LF-MCG/0.5 injection    Sig: Inject 0.5 mLs into the muscle once for 1 dose.    Dispense:  0.5 mL    Refill:  0  . Zoster Vaccine Adjuvanted [facility]) injection    Sig: Inject 0.5 mLs into the muscle once for 1 dose.    Dispense:  1 each    Refill:  1    Medications Discontinued During This Encounter  Medication Reason  . Nutritional Supplements (EQ ESTROBLEND MENOPAUSE PO)     Follow-up: No follow-ups on file.   Sherlene Shams, MD

## 2020-12-17 NOTE — Patient Instructions (Signed)
You are due for your tetanus-diptheria-pertussis vaccine (TDaP) but you can get it for less $$$ at a local pharmacy with the script I have provided you.     The ShingRx vaccine is now available in local pharmacies and is much more protective than the old one  Zostavax  (it is about 97%  Effective in preventing shingles). .   It is therefore ADVISED for all interested adults over 50 to prevent shingles so I have printed you a prescription for it.  (it requires a 2nd dose 2 to 6 months after the first one) .  It will cause you to have flu  like symptoms for 2 days  Podiatry and GI referrals are in progress    Health Maintenance for Postmenopausal Women Menopause is a normal process in which your ability to get pregnant comes to an end. This process happens slowly over many months or years, usually between the ages of 44 and 60. Menopause is complete when you have missed your menstrual periods for 12 months. It is important to talk with your health care provider about some of the most common conditions that affect women after menopause (postmenopausal women). These include heart disease, cancer, and bone loss (osteoporosis). Adopting a healthy lifestyle and getting preventive care can help to promote your health and wellness. The actions you take can also lower your chances of developing some of these common conditions. What should I know about menopause? During menopause, you may get a number of symptoms, such as:  Hot flashes. These can be moderate or severe.  Night sweats.  Decrease in sex drive.  Mood swings.  Headaches.  Tiredness.  Irritability.  Memory problems.  Insomnia. Choosing to treat or not to treat these symptoms is a decision that you make with your health care provider. Do I need hormone replacement therapy?  Hormone replacement therapy is effective in treating symptoms that are caused by menopause, such as hot flashes and night sweats.  Hormone replacement carries  certain risks, especially as you become older. If you are thinking about using estrogen or estrogen with progestin, discuss the benefits and risks with your health care provider. What is my risk for heart disease and stroke? The risk of heart disease, heart attack, and stroke increases as you age. One of the causes may be a change in the body's hormones during menopause. This can affect how your body uses dietary fats, triglycerides, and cholesterol. Heart attack and stroke are medical emergencies. There are many things that you can do to help prevent heart disease and stroke. Watch your blood pressure  High blood pressure causes heart disease and increases the risk of stroke. This is more likely to develop in people who have high blood pressure readings, are of African descent, or are overweight.  Have your blood pressure checked: ? Every 3-5 years if you are 47-57 years of age. ? Every year if you are 17 years old or older. Eat a healthy diet  Eat a diet that includes plenty of vegetables, fruits, low-fat dairy products, and lean protein.  Do not eat a lot of foods that are high in solid fats, added sugars, or sodium.   Get regular exercise Get regular exercise. This is one of the most important things you can do for your health. Most adults should:  Try to exercise for at least 150 minutes each week. The exercise should increase your heart rate and make you sweat (moderate-intensity exercise).  Try to do strengthening exercises at  least twice each week. Do these in addition to the moderate-intensity exercise.  Spend less time sitting. Even light physical activity can be beneficial. Other tips  Work with your health care provider to achieve or maintain a healthy weight.  Do not use any products that contain nicotine or tobacco, such as cigarettes, e-cigarettes, and chewing tobacco. If you need help quitting, ask your health care provider.  Know your numbers. Ask your health care  provider to check your cholesterol and your blood sugar (glucose). Continue to have your blood tested as directed by your health care provider. Do I need screening for cancer? Depending on your health history and family history, you may need to have cancer screening at different stages of your life. This may include screening for:  Breast cancer.  Cervical cancer.  Lung cancer.  Colorectal cancer. What is my risk for osteoporosis? After menopause, you may be at increased risk for osteoporosis. Osteoporosis is a condition in which bone destruction happens more quickly than new bone creation. To help prevent osteoporosis or the bone fractures that can happen because of osteoporosis, you may take the following actions:  If you are 80-35 years old, get at least 1,000 mg of calcium and at least 600 mg of vitamin D per day.  If you are older than age 15 but younger than age 46, get at least 1,200 mg of calcium and at least 600 mg of vitamin D per day.  If you are older than age 62, get at least 1,200 mg of calcium and at least 800 mg of vitamin D per day. Smoking and drinking excessive alcohol increase the risk of osteoporosis. Eat foods that are rich in calcium and vitamin D, and do weight-bearing exercises several times each week as directed by your health care provider. How does menopause affect my mental health? Depression may occur at any age, but it is more common as you become older. Common symptoms of depression include:  Low or sad mood.  Changes in sleep patterns.  Changes in appetite or eating patterns.  Feeling an overall lack of motivation or enjoyment of activities that you previously enjoyed.  Frequent crying spells. Talk with your health care provider if you think that you are experiencing depression. General instructions See your health care provider for regular wellness exams and vaccines. This may include:  Scheduling regular health, dental, and eye exams.  Getting  and maintaining your vaccines. These include: ? Influenza vaccine. Get this vaccine each year before the flu season begins. ? Pneumonia vaccine. ? Shingles vaccine. ? Tetanus, diphtheria, and pertussis (Tdap) booster vaccine. Your health care provider may also recommend other immunizations. Tell your health care provider if you have ever been abused or do not feel safe at home. Summary  Menopause is a normal process in which your ability to get pregnant comes to an end.  This condition causes hot flashes, night sweats, decreased interest in sex, mood swings, headaches, or lack of sleep.  Treatment for this condition may include hormone replacement therapy.  Take actions to keep yourself healthy, including exercising regularly, eating a healthy diet, watching your weight, and checking your blood pressure and blood sugar levels.  Get screened for cancer and depression. Make sure that you are up to date with all your vaccines. This information is not intended to replace advice given to you by your health care provider. Make sure you discuss any questions you have with your health care provider. Document Revised: 08/18/2018 Document Reviewed: 08/18/2018  Elsevier Patient Education  2021 [employer].

## 2020-12-17 NOTE — Assessment & Plan Note (Signed)
She has requested referral to Podiatry for surgical correction

## 2020-12-17 NOTE — Assessment & Plan Note (Signed)

## 2020-12-17 NOTE — Assessment & Plan Note (Signed)
I have addressed  BMI and recommended a low glycemic index diet utilizing smaller more frequent meals to increase metabolism.  I have also recommended that patient start exercising with a goal of 30 minutes of aerobic exercise a minimum of 5 days per week. Screening for lipid disorders, thyroid and diabetes to be done today.  Lab Results  Component Value Date   TSH 1.91 12/17/2020   Lab Results  Component Value Date   CHOL 145 12/17/2020   HDL 40.30 12/17/2020   LDLCALC 75 12/17/2020   LDLDIRECT 117.0 12/14/2019   TRIG 150.0 (H) 12/17/2020   CHOLHDL 4 12/17/2020   Lab Results  Component Value Date   HGBA1C 5.8 12/17/2020

## 2021-01-08 ENCOUNTER — Other Ambulatory Visit: Payer: Self-pay

## 2021-01-08 MED ORDER — TETANUS-DIPHTHERIA TOXOIDS TD 2-2 LF/0.5ML IM SUSP
INTRAMUSCULAR | 0 refills | Status: DC
Start: 2021-01-08 — End: 2022-12-22
  Filled 2021-01-08: qty 0.5, 1d supply, fill #0

## 2021-01-08 MED ORDER — ZOSTER VAC RECOMB ADJUVANTED 50 MCG/0.5ML IM SUSR
INTRAMUSCULAR | 1 refills | Status: DC
  Filled 2021-01-08: qty 0.5, 1d supply, fill #0

## 2021-01-25 ENCOUNTER — Other Ambulatory Visit: Payer: Self-pay | Admitting: Podiatry

## 2021-01-31 ENCOUNTER — Other Ambulatory Visit: Payer: Self-pay

## 2021-01-31 ENCOUNTER — Encounter: Payer: Self-pay | Admitting: Podiatry

## 2021-02-01 NOTE — Discharge Instructions (Addendum)
[facility]  POST OPERATIVE INSTRUCTIONS FOR DR. Ether Griffins AND DR. BAKER [facility] PODIATRY DEPARTMENT   Take your medication as prescribed.  Pain medication should be taken only as needed.  You may supplement pain medication with ibuprofen and Tylenol as needed.  If still having severe pain or discomfort despite taking medications then you could try taking 1 tablet of Norco 7.5/325 every 4 hours instead of every 6.  If pain is still severe and out of control then please call clinic for further instruction.  Begin taking aspirin twice daily starting tomorrow for DVT prophylaxis.  Take antibiotic as prescribed until gone for infection prophylaxis  Keep the dressing clean, dry and intact.  Remain nonweightbearing to the right lower extremity at all times.  Keep your foot elevated above the heart level for the first 48 hours.  Continue ice and elevation after that time to improve swelling.  Try to keep your foot above heart level to improve swelling.   Always use crutches, knee scooter, or wheelchair when getting around and do not place any weight on the right foot.  Do not take a shower. Baths are permissible as long as the foot is kept out of the water.   Every hour you are awake:  Bend your knee 15 times. Massage calf 15 times  Call [facility] [phone]) if any of the following problems occur: You develop a temperature or fever. The bandage becomes saturated with blood. Medication does not stop your pain. Injury of the foot occurs. Any symptoms of infection including redness, odor, or red streaks running from wound.    General Anesthesia, Adult, Care After This sheet gives you information about how to care for yourself after your procedure. Your health care provider may also give you more specific instructions. If you have problems or questions, contact your health care provider. What can I expect after the procedure? After the  procedure, the following side effects are common: Pain or discomfort at the IV site. Nausea. Vomiting. Sore throat. Trouble concentrating. Feeling cold or chills. Feeling weak or tired. Sleepiness and fatigue. Soreness and body aches. These side effects can affect parts of the body that were not involved in surgery. Follow these instructions at home: For the time period you were told by your health care provider: Rest. Do not participate in activities where you could fall or become injured. Do not drive or use machinery. Do not drink alcohol. Do not take sleeping pills or medicines that cause drowsiness. Do not make important decisions or sign legal documents. Do not take care of children on your own.   Eating and drinking Follow any instructions from your health care provider about eating or drinking restrictions. When you feel hungry, start by eating small amounts of foods that are soft and easy to digest (bland), such as toast. Gradually return to your regular diet. Drink enough fluid to keep your urine pale yellow. If you vomit, rehydrate by drinking water, juice, or clear broth. General instructions If you have sleep apnea, surgery and certain medicines can increase your risk for breathing problems. Follow instructions from your health care provider about wearing your sleep device: Anytime you are sleeping, including during daytime naps. While taking prescription pain medicines, sleeping medicines, or medicines that make you drowsy. Have a responsible adult stay with you for the time you are told. It is important to have someone help care for you until you are awake and alert. Return to your normal  activities as told by your health care provider. Ask your health care provider what activities are safe for you. Take over-the-counter and prescription medicines only as told by your health care provider. If you smoke, do not smoke without supervision. Keep all follow-up visits as told  by your health care provider. This is important. Contact a health care provider if: You have nausea or vomiting that does not get better with medicine. You cannot eat or drink without vomiting. You have pain that does not get better with medicine. You are unable to pass urine. You develop a skin rash. You have a fever. You have redness around your IV site that gets worse. Get help right away if: You have difficulty breathing. You have chest pain. You have blood in your urine or stool, or you vomit blood. Summary After the procedure, it is common to have a sore throat or nausea. It is also common to feel tired. Have a responsible adult stay with you for the time you are told. It is important to have someone help care for you until you are awake and alert. When you feel hungry, start by eating small amounts of foods that are soft and easy to digest (bland), such as toast. Gradually return to your regular diet. Drink enough fluid to keep your urine pale yellow. Return to your normal activities as told by your health care provider. Ask your health care provider what activities are safe for you. This information is not intended to replace advice given to you by your health care provider. Make sure you discuss any questions you have with your health care provider. Document Revised: 05/10/2020 Document Reviewed: 12/08/2019 Elsevier Patient Education  2021 [employer].

## 2021-02-14 ENCOUNTER — Other Ambulatory Visit: Payer: Self-pay

## 2021-02-14 ENCOUNTER — Encounter: Payer: Self-pay | Admitting: Podiatry

## 2021-02-14 ENCOUNTER — Ambulatory Visit: Payer: No Typology Code available for payment source | Admitting: Anesthesiology

## 2021-02-14 ENCOUNTER — Ambulatory Visit
Admission: RE | Admit: 2021-02-14 | Discharge: 2021-02-14 | Disposition: A | Discharge status: Home / Self Care | Payer: No Typology Code available for payment source | Attending: Podiatry | Admitting: Podiatry

## 2021-02-14 ENCOUNTER — Encounter
Admission: RE | Disposition: A | Discharge status: Home / Self Care | Payer: Self-pay | Source: Home / Self Care | Attending: Podiatry

## 2021-02-14 DIAGNOSIS — M2011 Hallux valgus (acquired), right foot: Secondary | ICD-10-CM | POA: Insufficient documentation

## 2021-02-14 DIAGNOSIS — M25871 Other specified joint disorders, right ankle and foot: Secondary | ICD-10-CM | POA: Insufficient documentation

## 2021-02-14 DIAGNOSIS — M21271 Flexion deformity, right ankle and toes: Secondary | ICD-10-CM | POA: Diagnosis not present

## 2021-02-14 DIAGNOSIS — M2041 Other hammer toe(s) (acquired), right foot: Secondary | ICD-10-CM | POA: Insufficient documentation

## 2021-02-14 DIAGNOSIS — Z09 Encounter for follow-up examination after completed treatment for conditions other than malignant neoplasm: Secondary | ICD-10-CM

## 2021-02-14 HISTORY — PX: HAMMER TOE SURGERY: SHX385

## 2021-02-14 HISTORY — PX: WEIL OSTEOTOMY: SHX5044

## 2021-02-14 HISTORY — PX: BUNIONECTOMY: SHX129

## 2021-02-14 SURGERY — BUNIONECTOMY
Anesthesia: General | Site: Toe | Laterality: Right

## 2021-02-14 MED ORDER — POVIDONE-IODINE 7.5 % EX SOLN: Freq: Once | CUTANEOUS | Status: AC

## 2021-02-14 MED ORDER — BUPIVACAINE HCL (PF) 0.25 % IJ SOLN
INTRAMUSCULAR | Status: DC | PRN
  Administered 2021-02-14: 10 mL

## 2021-02-14 MED ORDER — BUPIVACAINE LIPOSOME 1.3 % IJ SUSP
INTRAMUSCULAR | Status: DC | PRN
  Administered 2021-02-14: 20 mL via PERINEURAL

## 2021-02-14 MED ORDER — HYDROCODONE-ACETAMINOPHEN 7.5-325 MG PO TABS
1.0000 | ORAL_TABLET | Freq: Four times a day (QID) | ORAL | 0 refills | Status: AC | PRN
  Filled 2021-02-14: qty 28, 7d supply, fill #0

## 2021-02-14 MED ORDER — FENTANYL CITRATE (PF) 100 MCG/2ML IJ SOLN: 25.0000 ug | INTRAMUSCULAR | Status: DC | PRN

## 2021-02-14 MED ORDER — PROPOFOL 10 MG/ML IV BOLUS
INTRAVENOUS | Status: DC | PRN
  Administered 2021-02-14: 50 mg via INTRAVENOUS
  Administered 2021-02-14: 150 mg via INTRAVENOUS

## 2021-02-14 MED ORDER — ONDANSETRON HCL 4 MG/2ML IJ SOLN: 4.0000 mg | Freq: Once | INTRAMUSCULAR | Status: DC | PRN

## 2021-02-14 MED ORDER — ACETAMINOPHEN 325 MG PO TABS: 325.0000 mg | ORAL_TABLET | ORAL | Status: DC | PRN

## 2021-02-14 MED ORDER — ACETAMINOPHEN 160 MG/5ML PO SOLN: 325.0000 mg | ORAL | Status: DC | PRN

## 2021-02-14 MED ORDER — SCOPOLAMINE 1 MG/3DAYS TD PT72
1.0000 | MEDICATED_PATCH | Freq: Once | TRANSDERMAL | Status: DC
  Administered 2021-02-14: 1.5 mg via TRANSDERMAL

## 2021-02-14 MED ORDER — FENTANYL CITRATE (PF) 100 MCG/2ML IJ SOLN
INTRAMUSCULAR | Status: DC | PRN
  Administered 2021-02-14: 100 ug via INTRAVENOUS

## 2021-02-14 MED ORDER — LACTATED RINGERS IV SOLN: INTRAVENOUS | Status: DC

## 2021-02-14 MED ORDER — LIDOCAINE HCL (CARDIAC) PF 100 MG/5ML IV SOSY
PREFILLED_SYRINGE | INTRAVENOUS | Status: DC | PRN
  Administered 2021-02-14: 60 mg via INTRATRACHEAL

## 2021-02-14 MED ORDER — BUPIVACAINE HCL (PF) 0.5 % IJ SOLN
INTRAMUSCULAR | Status: DC | PRN
  Administered 2021-02-14: 20 mL via PERINEURAL

## 2021-02-14 MED ORDER — CEPHALEXIN 500 MG PO CAPS
500.0000 mg | ORAL_CAPSULE | Freq: Three times a day (TID) | ORAL | 0 refills | Status: AC
  Filled 2021-02-14: qty 21, 7d supply, fill #0

## 2021-02-14 MED ORDER — ONDANSETRON HCL 4 MG/2ML IJ SOLN
INTRAMUSCULAR | Status: DC | PRN
  Administered 2021-02-14: 4 mg via INTRAVENOUS

## 2021-02-14 MED ORDER — ONDANSETRON HCL 4 MG PO TABS
4.0000 mg | ORAL_TABLET | Freq: Three times a day (TID) | ORAL | 0 refills | Status: DC | PRN
  Filled 2021-02-14: qty 20, 7d supply, fill #0

## 2021-02-14 MED ORDER — CEFAZOLIN SODIUM-DEXTROSE 2-4 GM/100ML-% IV SOLN
2.0000 g | INTRAVENOUS | Status: AC
  Administered 2021-02-14: 2 g via INTRAVENOUS

## 2021-02-14 MED ORDER — MIDAZOLAM HCL 5 MG/5ML IJ SOLN
INTRAMUSCULAR | Status: DC | PRN
  Administered 2021-02-14: 2 mg via INTRAVENOUS

## 2021-02-14 MED ORDER — ASPIRIN EC 325 MG PO TBEC
325.0000 mg | DELAYED_RELEASE_TABLET | Freq: Two times a day (BID) | ORAL | 0 refills | Status: AC
  Filled 2021-02-14: qty 100, 50d supply, fill #0

## 2021-02-14 MED ORDER — DEXAMETHASONE SODIUM PHOSPHATE 4 MG/ML IJ SOLN
INTRAMUSCULAR | Status: DC | PRN
  Administered 2021-02-14: 4 mg via INTRAVENOUS

## 2021-02-14 SURGICAL SUPPLY — 71 items
BIT DRILL 1.7 LNG CANN (DRILL) ×3 IMPLANT
BIT DRILL 2 FENESTRATED (MISCELLANEOUS) ×2 IMPLANT
BIT DRILL CANNULTD 2.6 X 130MM (DRILL) ×2 IMPLANT
BIT DRILL SOLID 2.0 X 110MM (DRILL) ×2 IMPLANT
BIT DRILLL 2 FENESTRATED (MISCELLANEOUS) ×1
BLADE OSC/SAGITTAL MD 5.5X18 (BLADE) ×3 IMPLANT
BLADE OSCILLATING/SAGITTAL (BLADE) ×3
BLADE SURG 15 STRL LF DISP TIS (BLADE) ×4 IMPLANT
BLADE SURG 15 STRL SS (BLADE) ×6
BLADE SW THK.38XMED LNG THN (BLADE) ×2 IMPLANT
BNDG CMPR 75X41 PLY HI ABS (GAUZE/BANDAGES/DRESSINGS) ×2
BNDG CMPR STD VLCR NS LF 5.8X4 (GAUZE/BANDAGES/DRESSINGS) ×2
BNDG CMPR STD VLCR NS LF 5.8X6 (GAUZE/BANDAGES/DRESSINGS) ×2
BNDG COHESIVE 4X5 TAN STRL (GAUZE/BANDAGES/DRESSINGS) ×3 IMPLANT
BNDG ELASTIC 4X5.8 VLCR NS LF (GAUZE/BANDAGES/DRESSINGS) ×3 IMPLANT
BNDG ELASTIC 6X5.8 VLCR NS LF (GAUZE/BANDAGES/DRESSINGS) ×3 IMPLANT
BNDG ESMARK 4X12 TAN STRL LF (GAUZE/BANDAGES/DRESSINGS) ×3 IMPLANT
BNDG GAUZE 1X2.1 STRL (MISCELLANEOUS) ×3 IMPLANT
BNDG STRETCH 4X75 STRL LF (GAUZE/BANDAGES/DRESSINGS) ×3 IMPLANT
CANISTER SUCT 1200ML W/VALVE (MISCELLANEOUS) ×3 IMPLANT
CLIP FIXATION STAPLE 10X10X10 (Staple) ×3 IMPLANT
CNTRSNK DRL 2 SCR (MISCELLANEOUS) ×2 IMPLANT
COUNTERSICK 4.0 HEADED (MISCELLANEOUS) ×3
COUNTERSINK 2.0 (MISCELLANEOUS) ×3
COVER LIGHT HANDLE UNIVERSAL (MISCELLANEOUS) ×6 IMPLANT
CUFF TOURN SGL QUICK 18X4 (TOURNIQUET CUFF) ×3 IMPLANT
DRAPE FLUOR MINI C-ARM 54X84 (DRAPES) ×3 IMPLANT
DRILL CANNULATED 2.6 X 130MM (DRILL) ×3
DRILL SOLID 2.0 X 110MM (DRILL) ×3
DURAPREP 26ML APPLICATOR (WOUND CARE) ×3 IMPLANT
ELECT REM PT RETURN 9FT ADLT (ELECTROSURGICAL) ×3
ELECTRODE REM PT RTRN 9FT ADLT (ELECTROSURGICAL) ×2 IMPLANT
ETHIBOND 2 0 GREEN CT 2 30IN (SUTURE) ×3 IMPLANT
GAUZE SPONGE 4X4 12PLY STRL (GAUZE/BANDAGES/DRESSINGS) ×3 IMPLANT
GAUZE XEROFORM 1X8 LF (GAUZE/BANDAGES/DRESSINGS) ×3 IMPLANT
GLOVE SURG ENC MOIS LTX SZ7 (GLOVE) ×3 IMPLANT
GLOVE SURG POLYISO LF SZ7 (GLOVE) ×3 IMPLANT
GOWN STRL REUS W/ TWL LRG LVL3 (GOWN DISPOSABLE) ×4 IMPLANT
GOWN STRL REUS W/TWL LRG LVL3 (GOWN DISPOSABLE) ×6
K-WIRE SMOOTH TROCAR 2.0X150 (WIRE) ×6
K-WIRE SNGL END 1.2X150 (MISCELLANEOUS) ×6
KIT PROCEDURE DRILL (DRILL) ×3 IMPLANT
KIT TURNOVER KIT A (KITS) ×3 IMPLANT
KWIRE SMOOTH TROCAR 2.0X150 (WIRE) ×4 IMPLANT
KWIRE SNGL END 1.2X150 (MISCELLANEOUS) ×4 IMPLANT
NS IRRIG 500ML POUR BTL (IV SOLUTION) ×3 IMPLANT
PACK EXTREMITY ARMC (MISCELLANEOUS) ×3 IMPLANT
PADDING CAST BLEND 4X4 NS (MISCELLANEOUS) ×12 IMPLANT
PENCIL SMOKE EVACUATOR (MISCELLANEOUS) ×3 IMPLANT
PIN BALLS 3/8 F/.045 WIRE (MISCELLANEOUS) ×3 IMPLANT
PLATE LAPIDUS 3H STD RT (Plate) ×6 IMPLANT
SCREW CANN 4.0X40 SHORT THREAD (Screw) ×3 IMPLANT
SCREW COUNTERSINK 4.0 HEADED (MISCELLANEOUS) ×2 IMPLANT
SCREW HEAD ST 12X2XHD NS (Screw) ×2 IMPLANT
SCREW HEADED 2.0X12 (Screw) ×3 IMPLANT
SCREW LOCK PLATE R3 2.7X15 (Screw) ×3 IMPLANT
SCREW LOCK PLATE R3 2.7X32 (Screw) ×3 IMPLANT
SCREW NON LOCKING PLATE 2.7X14 (Screw) ×3 IMPLANT
SPLINT CAST 1 STEP 4X30 (MISCELLANEOUS) ×3 IMPLANT
STOCKINETTE IMPERVIOUS LG (DRAPES) ×3 IMPLANT
SUT ETHILON 4-0 (SUTURE) ×9
SUT ETHILON 4-0 FS2 18XMFL BLK (SUTURE) ×6
SUT MNCRL 4-0 (SUTURE) ×3
SUT MNCRL 4-0 27XMFL (SUTURE) ×2
SUT VIC AB 3-0 SH 27 (SUTURE) ×6
SUT VIC AB 3-0 SH 27X BRD (SUTURE) ×4 IMPLANT
SUT VIC AB 4-0 FS2 27 (SUTURE) ×3 IMPLANT
SUTURE ETHLN 4-0 FS2 18XMF BLK (SUTURE) ×6 IMPLANT
SUTURE MNCRL 4-0 27XMF (SUTURE) ×2 IMPLANT
WIRE OLIVE SMOOTH 1.4MMX60MM (WIRE) ×6 IMPLANT
WIRE SMOOTH TROCAR .9MMX150MML (WIRE) ×3 IMPLANT

## 2021-02-14 NOTE — Anesthesia Procedure Notes (Signed)
Procedure Name: LMA Insertion Date/Time: 02/14/2021 8:31 AM Performed by: Lily Kocher, CRNA Pre-anesthesia Checklist: Patient identified, Patient being monitored, Timeout performed, Emergency Drugs available and Suction available Patient Re-evaluated:Patient Re-evaluated prior to induction Oxygen Delivery Method: Circle system utilized Preoxygenation: Pre-oxygenation with 100% oxygen Induction Type: IV induction Ventilation: Mask ventilation without difficulty LMA: LMA inserted LMA Size: 4.0 Tube type: Oral Number of attempts: 1 Placement Confirmation: positive ETCO2 and breath sounds checked- equal and bilateral Tube secured with: Tape Dental Injury: Teeth and Oropharynx as per pre-operative assessment

## 2021-02-14 NOTE — H&P (Signed)
HISTORY AND PHYSICAL INTERVAL NOTE:  02/14/2021  8:20 AM  Victoria Barrett  has presented today for surgery, with the diagnosis of M20.1 - Hallux valgus M20.4 - Hammertoe M20.5X - Overlapping toe M21.6X - Plantar displaced metatarsal.  The various methods of treatment have been discussed with the patient.  No guarantees were given.  After consideration of risks, benefits and other options for treatment, the patient has consented to surgery.  I have reviewed the patients' chart and labs.    PROCEDURE: ALL RIGHT FOOT LAPIDUS BUNIONECTOMY WITH POSSIBLE AKIN OSTEOTOMY 2ND METATARSAL WEIL OSTEOTOMY 2ND HAMMERTOE REPAIR WITH POSSIBLE FLEXOR TENDON TRANSFER  A history and physical examination was performed in my office.  The patient was reexamined.  There have been no changes to this history and physical examination.  Rosetta Posner, DPM

## 2021-02-14 NOTE — Anesthesia Procedure Notes (Signed)
Anesthesia Regional Block: Popliteal block   Pre-Anesthetic Checklist: , timeout performed,  Correct Patient, Correct Site, Correct Laterality,  Correct Procedure, Correct Position, site marked,  Risks and benefits discussed,  Surgical consent,  Pre-op evaluation,  At surgeon's request and post-op pain management  Laterality: Right  Prep: chloraprep       Needles:  Injection technique: Single-shot  Needle Type: Echogenic Needle     Needle Length: 9cm  Needle Gauge: 21     Additional Needles:   Procedures:,,,, ultrasound used (permanent image in chart),,    Narrative:  Start time: 02/14/2021 7:53 AM End time: 02/14/2021 8:05 AM Injection made incrementally with aspirations every 5 mL.  Performed by: Personally  Anesthesiologist: Ranee Gosselin, MD  Additional Notes: Functioning IV was confirmed and monitors applied. Ultrasound guidance: relevant anatomy identified, needle position confirmed, local anesthetic spread visualized around nerve(s)., vascular puncture avoided.  Image printed for medical record.  Negative aspiration and no paresthesias; incremental administration of local anesthetic. The patient tolerated the procedure well. Vitals signes recorded in RN notes.  15 ml 0.5% Bupi, 15 ml Exparel

## 2021-02-14 NOTE — Anesthesia Procedure Notes (Signed)
Anesthesia Regional Block: Adductor canal block   Pre-Anesthetic Checklist: , timeout performed,  Correct Patient, Correct Site, Correct Laterality,  Correct Procedure, Correct Position, site marked,  Risks and benefits discussed,  Surgical consent,  Pre-op evaluation,  At surgeon's request and post-op pain management  Laterality: Right  Prep: chloraprep       Needles:  Injection technique: Single-shot  Needle Type: Echogenic Needle     Needle Length: 9cm  Needle Gauge: 21     Additional Needles:   Procedures:,,,, ultrasound used (permanent image in chart),,    Narrative:  Start time: 02/14/2021 7:53 AM End time: 02/14/2021 8:05 AM Injection made incrementally with aspirations every 5 mL.  Performed by: Personally  Anesthesiologist: Ranee Gosselin, MD  Additional Notes: Functioning IV was confirmed and monitors applied. Ultrasound guidance: relevant anatomy identified, needle position confirmed, local anesthetic spread visualized around nerve(s)., vascular puncture avoided.  Image printed for medical record.  Negative aspiration and no paresthesias; incremental administration of local anesthetic. The patient tolerated the procedure well. Vitals signes recorded in RN notes. 5 ml 0.5% Bupi, 5 ml Exparel

## 2021-02-14 NOTE — Op Note (Signed)
PODIATRY / FOOT AND ANKLE SURGERY OPERATIVE REPORT    SURGEON: Rosetta Posner, DPM  PRE-OPERATIVE DIAGNOSIS: All right foot 1.  Hallux valgus with increased hallux interphalangeus angle 2.  Long plantarflexed second metatarsal 3.  Hammertoe contracture second toe 4.  Right foot pain 5.  Hypermobile first tarsometatarsal joint and intermediate cuneiform joint  POST-OPERATIVE DIAGNOSIS: Same  PROCEDURE(S): All right foot 1.  Lapidus bunionectomy with Akin osteotomy 2.  Second metatarsal Weil osteotomy 3.  Flexor tendon transfer second toe   HEMOSTASIS: Right high ankle tourniquet  ANESTHESIA: general with preoperative popliteal and saphenous nerve block performed by anesthesia  ESTIMATED BLOOD LOSS: 30 cc  FINDING(S): 1.  Long plantarflexed second metatarsal, hammertoe contracture with floating toe 2.  Hallux valgus contracture with hypermobility noted at the first tarsometatarsal joint and intermediate cuneiform joint  PATHOLOGY/SPECIMEN(S): None  INDICATIONS:   Victoria Barrett is a 60 y.o. female who presents with a painful bunion to the right first metatarsal phalangeal joint with hypermobile joints also noted at the first tarsometatarsal joint and intercuneiform joints.  Patient also has pain at the second metatarsal phalangeal joint with slight elevation of the second toe and slight contracture at the PIPJ.  Patient has exhausted conservative measures of changes in shoe gear, orthoses, taping, strapping, padding, anti-inflammatories, and all other conservative measures but still is having pain discomfort.  Discussed all treatment options with the patient both conservative and surgical attempts at correction include potential risks and complications at this time patient is elected for surgical procedure described above to the right foot.  No guarantees given.  Consent signed prior to procedure..  DESCRIPTION: After obtaining full informed written consent, the patient was brought  back to the operating room and placed supine upon the operating table.  The patient received IV antibiotics prior to induction.  A right ankle tourniquet was placed.  After obtaining adequate anesthesia, the patient was prepped and draped in the standard fashion.  An Esmarch bandage was used to exsanguinate the right lower extremity and the pneumatic ankle tourniquet was inflated.  Attention was directed to the first tarsometatarsal joint and first metatarsal phalangeal joint where a linear longitudinal incision was made along the entire course following the contour of the deformity medial to the tendon of the extensor hallucis longus.  The incision was deepened to the subcutaneous tissues utilizing sharp and blunt dissection and care was taken to identify and retract all vital neurovascular structures and all venous contributories were cauterized as necessary.  At this time a capsular incision was made at the first metatarsal phalangeal joint and first tarsometatarsal joints and the periosteum and capsule was reflected medially and laterally at the operative site thereby exposing the joints.  The medial eminence was resected off the first metatarsal head and passed off in the operative site and smoothed to a normal contour as well as a small dorsal exostosis that was present.  The normal contour of the first metatarsal was then established by using the sagittal saw to smooth off any rough edges.  Attention was then directed to the first interspace via the same incision where the extensor tendon was retracted medially and the skin and subcutaneous tissue was retracted laterally.  At this time a lateral release was performed with a 15 blade releasing the collateral and suspensory ligaments as well as the conjoined tendon of the adductor hallucis.  Attention was then directed to the first tarsometatarsal joint where a osteotome was used to open up the joint further  and break through the plantar capsule and  ligaments.  The sagittal bone saw was then ran through the joint perpendicular to the joint surface breaking up any adhesions or bony shelves in this area.  The joint distractor was then placed from Paragon 28 and to the first metatarsal base area had its cartilage removed with use of osteotome, curette, and sagittal saw.  Once all the cartilage was removed from this area the distractor was then removed and the metatarsal was then put into a rectus position reducing the intermetatarsal angle as well as the rotating the first metatarsal to its normal position.  At this time a sagittal bone saw was then used to resect the small wedge from the medial cuneiform taking more laterally than medially removing the articular cartilage parallel to the joint surface.  This bone was resected and passed off the operative site.  The surgical site was flushed with copious amounts normal sterile saline.  No cartilage appeared to remain at this time.  A fenestrating drill bit was then used to fenestrate both joint surfaces.  Using the wire that was placed for the joint distractor the first metatarsal was derotated and reduced and held in a rectus position.  While holding this in a rectus position a guidewire was placed for a 4.0 cannulated Paragon 28 headed screw with the appropriate orientation from dorsal to plantar across the first tarsometatarsal joint and into the medial cuneiform joint.  This was done under fluoroscopic guidance and the correction appeared to be excellent overall as the first intermetatarsal space appeared to be reduced within normal limits and the sesamoids appeared to be rotated underneath the first metatarsal head with the appropriate orientation.  Second K wire was placed to hold fixation in a corrected position.  While holding the reduction using standard AO principles and techniques a 4.0 x 40 mm partially-threaded headed cannulated screw was placed across the first tarsometatarsal joint with excellent  compression noted.  The guidewires were removed and passed off the operative site.  The first metatarsal was then stressed slightly more noted to have some instability also at the intercuneiform joint.  The 4 hole Lapidus Paragon 28 placed was placed at the dorsal medial to slight medial aspect of the first tarsometatarsal joint with 2 holes being in the first metatarsal base and 2 being in the medial cuneiform.  All of wires were used to hold plate in proper placement.  1 locking screw was placed into the metatarsal base area and 1 locking screw was also placed into the medial cuneiform area.  These were all 2 7 screws.  Another 2 7 screw was then directed from the medial first cuneiform into the intermediate cuneiform all once again holding reduction.  Excellent compression was noted across this joint instability appeared to be improved across the intermediate cuneiform joint.  The final screw was then placed into the distal hole of the first metatarsal base and it was a nonlocking 2.7 x 14 mm screw and appeared to have excellent catch and the plate appeared to contour well to the first metatarsal.  The surgical site was flushed with copious amounts normal sterile saline.  The periosteum and capsular tissue along the entire courses of the incision was reapproximated well coapted with 3-0 Vicryl.  The subcutaneous tissue was reapproximated well coapted with 3-0 Vicryl and 4-0 Monocryl.  Attention was then directed to the distal aspect of the incision line where the incision was extended to the distal shaft portion of the  proximal phalanx of the hallux.  A periosteal incision was then made into the base and midshaft portion of the proximal phalanx of the hallux and dissected medially and laterally thereby exposing the bone at this level.  At this time an Akin osteotomy was performed and a small bone wedge was resected from this area and passed off the operative site.  The cut was then feathered for reduction and  the osteotomy site appeared to close and the hallux appeared to sit in a rectus position.  At this time a Paragon 28 10 mm nitinol staple was then placed with excellent compression noted.  The surgical site was flushed with copious amounts normal sterile saline.  The periosteal tissue was then reapproximated well coapted with 4-0 Monocryl along with the subcutaneous tissue and the skin along the entire incision line from the first tarsometatarsal joint to the hallux was reapproximated well coapted with 4-0 nylon in a combination of simple and horizontal mattress type stitching.  Under fluoroscopic guidance the hallux appeared to sit in a rectus position with the first intermetatarsal space angle well reduced and the sesamoids directly underneath the first metatarsal head.  Excellent compression was noted across the osteotomy site of the hallux and across the first tarsometatarsal joint.  The second metatarsal appeared to be elongated still and appeared to be significantly longer than the first and third metatarsals.  The second toe also appeared to be floating on the weightbearing surface with simulated weightbearing.  Attention was then directed to the second metatarsal phalangeal joint where a linear longitudinal incision was made from the second metatarsal head and neck area to the base of the middle phalanx of the second toe.  A linear longitudinal incision was made across this area and and dissection was continued deep through the subcutaneous tissues utilizing sharp and blunt dissection care was taken to identify and retract all vital neurovascular structures no venous contributories were cauterized necessary.  At this time a capsular and periosteal incision was made medial to the tendon of the extensor digitorum longus.  The capsular and periosteal tissue was reflected medially and laterally thereby exposing the head of the second metatarsal at the operative site.  At this time a distal Weil osteotomy was  performed with the appropriate orientation at the second metatarsal.  The second metatarsal head was pushed proximally approximately 5 mm and held in place with temporary fixation.  Fluoroscopic guidance was used to verify position of wire in length as well as second metatarsal head length.  Which appeared to be excellent as the parabola appeared to be normalized.  A 2.0 x 12 mm cannulated Paragon 28 headed screw was placed across the osteotomy site with excellent compression noted.  The dorsal overhang was then resected with a rongeur and passed off the operative site.  The second toe still appeared to slightly be floating with simulated weightbearing so it was determined at this time to perform a flexor tendon transfer.  There did not appear to be a contracture at the PIPJ though at this time after the Gerhard MunchWeil was performed.  Attention was directed through the same incision that was made at the level of the PIPJ and slightly distal to this at the second toe where deep dissection was continued down to the plantar surface from dorsal at the medial aspect of the toe.  The flexor tendon sheath was identified and incised and the flexor digitorum longus and brevis tendons were identified.  The flexor digitorum longus tendon was isolated  and transected as far distally as possible and the sheath was then opened up further to bring the tendon as far proximal as possible to the proximal phalanx midshaft and base level.  Blunt dissection was continued underneath the extensor tendon of the second toe.  The flexor tendon was then brought to the dorsal surface of the foot and passed underneath the extensor tendon.  The toe was then held in a rectus position with plantar flexion and the tendon was tightened to hold the reduction.  A hemostat was used then to clamp the tendon to hold the reduction.  Flexor tendon was then sutured to the extensor tendon with a combination of 2-0 Ethibond and 3-0 Vicryl.  The hemostat was released  and the excess tendon was transected and passed off the operative site.  The second toe appeared to sit in a rectus position with simulated weightbearing with no elevation.  The surgical site was flushed with copious amounts normal sterile saline.  The periosteal and capsular structures reapproximated well coapted with 3-0 Vicryl.  The subcutaneous tissue was reapproximated well coapted with 4-0 Monocryl.  The skin was then reapproximated well coapted with 4-0 nylon in simple and horizontal mattress type stitching.  The pneumatic ankle tourniquet was deflated during this time while suturing and a prompt hyperemic response was noted all digits of the right foot.  Final C-arm imaging was then taken showing correction of surgical procedure at the Lapidus bunionectomy site as well as Akin osteotomy and second metatarsal Weil and second digit which all appear to be sitting in a rectus in corrected position with excellent alignment.  An additional 10 cc of bupivacaine quarter percent was injected about the incision sites.  A postoperative dressing was then applied consisting of Xeroform followed by 4 x 4 gauze, Kling, Kerlix, web roll, posterior splint, Ace wrap.  Patient tolerated the procedure and anesthesia well was transferred to recovery room vital signs stable vascular status intact all toes the right foot.  Patient be discharged home with the appropriate follow-up orders and instructions as well as medications.  Patient is to remain nonweightbearing to the right lower extremity at all times.  Patient to follow-up in clinic within 1 week of surgical date at some point next week.  COMPLICATIONS: None  CONDITION: Good, stable  Rosetta Posner, DPM

## 2021-02-14 NOTE — Transfer of Care (Signed)
Immediate Anesthesia Transfer of Care Note  Patient: Victoria Barrett  Procedure(s) Performed: BUNIONECTOMY- Lapidus-Type Right + Akin (Right: Toe) WEIL OSTEOTOMY- Right 2nd (Right: Foot) HAMMER TOE CORRECTION- Right 2nd (Right: Toe)  Patient Location: PACU  Anesthesia Type: General LMA  Level of Consciousness: awake, alert  and patient cooperative  Airway and Oxygen Therapy: Patient Spontanous Breathing and Patient connected to supplemental oxygen  Post-op Assessment: Post-op Vital signs reviewed, Patient's Cardiovascular Status Stable, Respiratory Function Stable, Patent Airway and No signs of Nausea or vomiting  Post-op Vital Signs: Reviewed and stable  Complications: No notable events documented.

## 2021-02-14 NOTE — Anesthesia Preprocedure Evaluation (Signed)
Anesthesia Evaluation  Patient identified by MRN, date of birth, ID band Patient awake    Reviewed: Allergy & Precautions, H&P , NPO status , Patient's Chart, lab work & pertinent test results  Airway Mallampati: III  TM Distance: >3 FB Neck ROM: full    Dental no notable dental hx.    Pulmonary    Pulmonary exam normal breath sounds clear to auscultation       Cardiovascular Normal cardiovascular exam Rhythm:regular Rate:Normal     Neuro/Psych    GI/Hepatic   Endo/Other    Renal/GU      Musculoskeletal   Abdominal   Peds  Hematology   Anesthesia Other Findings   Reproductive/Obstetrics                             Anesthesia Physical Anesthesia Plan  ASA: 1  Anesthesia Plan: General LMA   Post-op Pain Management:  Regional for Post-op pain   Induction:   PONV Risk Score and Plan: 3 and Treatment may vary due to age or medical condition, Ondansetron, Dexamethasone and Scopolamine patch - Pre-op  Airway Management Planned:   Additional Equipment:   Intra-op Plan:   Post-operative Plan:   Informed Consent: I have reviewed the patients History and Physical, chart, labs and discussed the procedure including the risks, benefits and alternatives for the proposed anesthesia with the patient or authorized representative who has indicated his/her understanding and acceptance.     Dental Advisory Given  Plan Discussed with: CRNA  Anesthesia Plan Comments:         Anesthesia Quick Evaluation

## 2021-02-14 NOTE — Anesthesia Postprocedure Evaluation (Signed)
Anesthesia Post Note  Patient: Victoria Barrett  Procedure(s) Performed: BUNIONECTOMY- Lapidus-Type Right + Akin (Right: Toe) WEIL OSTEOTOMY- Right 2nd (Right: Foot) HAMMER TOE CORRECTION- Right 2nd (Right: Toe)     Patient location during evaluation: PACU Anesthesia Type: General Level of consciousness: awake and alert and oriented Pain management: satisfactory to patient Vital Signs Assessment: post-procedure vital signs reviewed and stable Respiratory status: spontaneous breathing, nonlabored ventilation and respiratory function stable Cardiovascular status: blood pressure returned to baseline and stable Postop Assessment: Adequate PO intake and No signs of nausea or vomiting Anesthetic complications: no   No notable events documented.  Cherly Beach

## 2021-02-14 NOTE — Progress Notes (Signed)
Assisted Mike Stella ANMD with right, popliteal/saphenous block. Side rails up, monitors on throughout procedure. See vital signs in flow sheet. Tolerated Procedure well. ° °

## 2021-02-15 ENCOUNTER — Encounter: Payer: Self-pay | Admitting: Podiatry

## 2021-06-18 ENCOUNTER — Other Ambulatory Visit: Payer: Self-pay

## 2021-06-18 MED ORDER — POLYETHYLENE GLYCOL 3350 17 GM/SCOOP PO POWD: ORAL | 0 refills | Status: DC

## 2021-06-18 MED ORDER — BISACODYL EC 5 MG PO TBEC: DELAYED_RELEASE_TABLET | ORAL | 0 refills | Status: DC

## 2021-09-18 ENCOUNTER — Encounter: Payer: Self-pay | Admitting: Gastroenterology

## 2021-09-18 NOTE — H&P (Signed)
Pre-Procedure H&P   Patient ID: Victoria Barrett is a 61 y.o. female.  Gastroenterology Provider: Jaynie Collins, DO  Referring Provider: Dr. Darrick Huntsman PCP: Sherlene Shams, MD  Date: 09/19/2021  HPI Victoria Barrett is a 61 y.o. female who presents today for Colonoscopy for surveillance colonoscopy; +fhx of polyps  Has h/o constipation taking stool softeners. BM w/o melena, hematochezia, diarrhea. No other acute gi complaints  2016 colonoscopy - no polyps; sigmoid diverticulosis  +Fhx colon polyps- father and sister; no fhx crc   Past Medical History:  Diagnosis Date   Chicken pox     Past Surgical History:  Procedure Laterality Date   ABLATION SAPHENOUS VEIN W/ RFA Bilateral 2014   BUNIONECTOMY Right 02/14/2021   Procedure: BUNIONECTOMY- Lapidus-Type Right + Akin;  Surgeon: Rosetta Posner, DPM;  Location: [facility];  Service: Podiatry;  Laterality: Right;  pre op pop/saph block   COLONOSCOPY WITH PROPOFOL N/A 05/24/2015   Procedure: COLONOSCOPY WITH PROPOFOL;  Surgeon: Wallace Cullens, MD;  Location: [facility];  Service: Gastroenterology;  Laterality: N/A;   FOOT SURGERY Right    ganglion cyst, removed by Troxler   HAMMER TOE SURGERY Right 02/14/2021   Procedure: HAMMER TOE CORRECTION- Right 2nd;  Surgeon: Rosetta Posner, DPM;  Location: [facility];  Service: Podiatry;  Laterality: Right;   salpingo ophect Left    WEIL OSTEOTOMY Right 02/14/2021   Procedure: WEIL OSTEOTOMY- Right 2nd;  Surgeon: Rosetta Posner, DPM;  Location: [facility];  Service: Podiatry;  Laterality: Right;    Family History father and sister with colon polyps No other h/o GI disease or malignancy  Review of Systems  Constitutional:  Negative for activity change, appetite change, chills, diaphoresis, fatigue, fever and unexpected weight change.  HENT:  Negative for trouble swallowing and voice change.   Respiratory:  Negative for shortness of breath and wheezing.    Cardiovascular:  Negative for chest pain, palpitations and leg swelling.  Gastrointestinal:  Positive for constipation (chronic- ok with stool softeners). Negative for abdominal distention, abdominal pain, anal bleeding, blood in stool, diarrhea, nausea, rectal pain and vomiting.  Musculoskeletal:  Negative for arthralgias and myalgias.  Skin:  Negative for color change and pallor.  Neurological:  Negative for dizziness, syncope and weakness.  Psychiatric/Behavioral:  Negative for confusion.   All other systems reviewed and are negative.   Medications No current facility-administered medications on file prior to encounter.   Current Outpatient Medications on File Prior to Encounter  Medication Sig Dispense Refill   diptheria-tetanus toxoids (TDVAX) 2-2 LF/0.5ML injection Inject into the muscle. 0.5 mL 0   docusate sodium (COLACE) 250 MG capsule Take 250 mg by mouth daily.     loratadine (CLARITIN) 10 MG tablet Take 10 mg by mouth daily as needed.     Multiple Vitamins-Minerals (MULTIVITAMIN WITH MINERALS) tablet Take 1 tablet by mouth daily.     naproxen sodium (ANAPROX) 220 MG tablet Take 220 mg by mouth as needed.      ondansetron (ZOFRAN) 4 MG tablet Take 1 tablet (4 mg total) by mouth every 8 (eight) hours as needed for nausea or vomiting. 20 tablet 0   Zoster Vaccine Adjuvanted [facility]) injection Inject into the muscle. 0.5 mL 1    Pertinent medications related to GI and procedure were reviewed by me with the patient prior to the procedure   Current Facility-Administered Medications:    0.9 %  sodium chloride infusion, , Intravenous, Continuous, Jaynie Collins,  DO, Last Rate: 20 mL/hr at 09/19/21 1031, 20 mL/hr at 09/19/21 1031      Allergies  Allergen Reactions   Codeine Other (See Comments)    Patient states causes bruising   Allergies were reviewed by me prior to the procedure  Objective    Vitals:   09/19/21 1017  BP: 124/75  Pulse: 91  Resp: 20  Temp:  (!) 96.8 F (36 C)  TempSrc: Temporal  SpO2: 100%  Weight: 73.5 kg  Height: 5\' 6"  (1.676 m)     Physical Exam Vitals and nursing note reviewed.  Constitutional:      General: She is not in acute distress.    Appearance: Normal appearance. She is not ill-appearing, toxic-appearing or diaphoretic.  HENT:     Head: Normocephalic and atraumatic.     Nose: Nose normal.     Mouth/Throat:     Mouth: Mucous membranes are moist.     Pharynx: Oropharynx is clear.  Eyes:     General: No scleral icterus.    Extraocular Movements: Extraocular movements intact.  Cardiovascular:     Rate and Rhythm: Normal rate and regular rhythm.     Heart sounds: Normal heart sounds. No murmur heard.   No friction rub. No gallop.  Pulmonary:     Effort: Pulmonary effort is normal. No respiratory distress.     Breath sounds: Normal breath sounds. No wheezing, rhonchi or rales.  Abdominal:     General: Bowel sounds are normal. There is no distension.     Palpations: Abdomen is soft.     Tenderness: There is no abdominal tenderness. There is no guarding or rebound.  Musculoskeletal:     Cervical back: Neck supple.     Right lower leg: No edema.     Left lower leg: No edema.  Skin:    General: Skin is warm and dry.     Coloration: Skin is not jaundiced or pale.  Neurological:     General: No focal deficit present.     Mental Status: She is alert and oriented to person, place, and time. Mental status is at baseline.  Psychiatric:        Mood and Affect: Mood normal.        Behavior: Behavior normal.        Thought Content: Thought content normal.        Judgment: Judgment normal.     Assessment:  Victoria Barrett is a 61 y.o. female  who presents today for Colonoscopy for surveillance- fhx colon polyps.  Plan:  Colonoscopy with possible intervention today  Colonoscopy with possible biopsy, control of bleeding, polypectomy, and interventions as necessary has been discussed with the  patient/patient representative. Informed consent was obtained from the patient/patient representative after explaining the indication, nature, and risks of the procedure including but not limited to death, bleeding, perforation, missed neoplasm/lesions, cardiorespiratory compromise, and reaction to medications. Opportunity for questions was given and appropriate answers were provided. Patient/patient representative has verbalized understanding is amenable to undergoing the procedure.   47, DO  [facility]  Portions of the record may have been created with voice recognition software. Occasional wrong-word or 'sound-a-like' substitutions may have occurred due to the inherent limitations of voice recognition software.  Read the chart carefully and recognize, using context, where substitutions may have occurred.

## 2021-09-19 ENCOUNTER — Ambulatory Visit: Payer: No Typology Code available for payment source | Admitting: Registered Nurse

## 2021-09-19 ENCOUNTER — Encounter: Payer: Self-pay | Admitting: Gastroenterology

## 2021-09-19 ENCOUNTER — Ambulatory Visit
Admission: RE | Admit: 2021-09-19 | Discharge: 2021-09-19 | Disposition: A | Discharge status: Home / Self Care | Payer: No Typology Code available for payment source | Attending: Gastroenterology | Admitting: Gastroenterology

## 2021-09-19 ENCOUNTER — Encounter
Admission: RE | Disposition: A | Discharge status: Home / Self Care | Payer: Self-pay | Source: Home / Self Care | Attending: Gastroenterology

## 2021-09-19 DIAGNOSIS — Z8371 Family history of colonic polyps: Secondary | ICD-10-CM | POA: Insufficient documentation

## 2021-09-19 DIAGNOSIS — K5730 Diverticulosis of large intestine without perforation or abscess without bleeding: Secondary | ICD-10-CM | POA: Insufficient documentation

## 2021-09-19 DIAGNOSIS — K5900 Constipation, unspecified: Secondary | ICD-10-CM | POA: Insufficient documentation

## 2021-09-19 DIAGNOSIS — Z1211 Encounter for screening for malignant neoplasm of colon: Secondary | ICD-10-CM | POA: Insufficient documentation

## 2021-09-19 DIAGNOSIS — K644 Residual hemorrhoidal skin tags: Secondary | ICD-10-CM | POA: Insufficient documentation

## 2021-09-19 DIAGNOSIS — K640 First degree hemorrhoids: Secondary | ICD-10-CM | POA: Diagnosis not present

## 2021-09-19 HISTORY — PX: COLONOSCOPY WITH PROPOFOL: SHX5780

## 2021-09-19 LAB — HM COLONOSCOPY

## 2021-09-19 SURGERY — COLONOSCOPY WITH PROPOFOL
Anesthesia: General

## 2021-09-19 MED ORDER — DEXMEDETOMIDINE (PRECEDEX) IN NS 20 MCG/5ML (4 MCG/ML) IV SYRINGE
PREFILLED_SYRINGE | INTRAVENOUS | Status: DC | PRN
  Administered 2021-09-19: 8 ug via INTRAVENOUS

## 2021-09-19 MED ORDER — PROPOFOL 10 MG/ML IV BOLUS
INTRAVENOUS | Status: DC | PRN
  Administered 2021-09-19: 10 mg via INTRAVENOUS
  Administered 2021-09-19: 70 mg via INTRAVENOUS
  Administered 2021-09-19 (×3): 10 mg via INTRAVENOUS

## 2021-09-19 MED ORDER — SODIUM CHLORIDE 0.9 % IV SOLN
INTRAVENOUS | Status: DC
  Administered 2021-09-19: 20 mL/h via INTRAVENOUS

## 2021-09-19 MED ORDER — PROPOFOL 500 MG/50ML IV EMUL
INTRAVENOUS | Status: AC
  Filled 2021-09-19: qty 50

## 2021-09-19 MED ORDER — LIDOCAINE HCL (CARDIAC) PF 100 MG/5ML IV SOSY
PREFILLED_SYRINGE | INTRAVENOUS | Status: DC | PRN
Start: 2021-09-19 — End: 2021-09-19
  Administered 2021-09-19: 40 mg via INTRAVENOUS

## 2021-09-19 MED ORDER — PROPOFOL 500 MG/50ML IV EMUL
INTRAVENOUS | Status: DC | PRN
  Administered 2021-09-19: 150 ug/kg/min via INTRAVENOUS

## 2021-09-19 NOTE — Interval H&P Note (Signed)
History and Physical Interval Note: Preprocedure H&P from 09/19/21  was reviewed and there was no interval change after seeing and examining the patient.  Written consent was obtained from the patient after discussion of risks, benefits, and alternatives. Patient has consented to proceed with Colonoscopy with possible intervention   09/19/2021 11:19 AM  Victoria Barrett  has presented today for surgery, with the diagnosis of Family hx of polyps.  The various methods of treatment have been discussed with the patient and family. After consideration of risks, benefits and other options for treatment, the patient has consented to  Procedure(s): COLONOSCOPY WITH PROPOFOL (N/A) as a surgical intervention.  The patient's history has been reviewed, patient examined, no change in status, stable for surgery.  I have reviewed the patient's chart and labs.  Questions were answered to the patient's satisfaction.     Annamaria Helling

## 2021-09-19 NOTE — Anesthesia Preprocedure Evaluation (Signed)
Anesthesia Evaluation  Patient identified by MRN, date of birth, ID band Patient awake    Reviewed: Allergy & Precautions, H&P , NPO status , Patient's Chart, lab work & pertinent test results, reviewed documented beta blocker date and time   History of Anesthesia Complications Negative for: history of anesthetic complications  Airway Mallampati: III  TM Distance: >3 FB Neck ROM: full    Dental  (+) Caps, Dental Advidsory Given, Implants, Teeth Intact   Pulmonary neg pulmonary ROS,    Pulmonary exam normal breath sounds clear to auscultation       Cardiovascular Exercise Tolerance: Good negative cardio ROS Normal cardiovascular exam Rhythm:regular Rate:Normal     Neuro/Psych negative neurological ROS  negative psych ROS   GI/Hepatic negative GI ROS, Neg liver ROS,   Endo/Other  negative endocrine ROS  Renal/GU negative Renal ROS  negative genitourinary   Musculoskeletal   Abdominal   Peds  Hematology negative hematology ROS (+)   Anesthesia Other Findings Past Medical History: No date: Chicken pox   Reproductive/Obstetrics negative OB ROS                             Anesthesia Physical Anesthesia Plan  ASA: 1  Anesthesia Plan: General   Post-op Pain Management:    Induction: Intravenous  PONV Risk Score and Plan: 3 and Propofol infusion and TIVA  Airway Management Planned: Natural Airway and Nasal Cannula  Additional Equipment:   Intra-op Plan:   Post-operative Plan:   Informed Consent: I have reviewed the patients History and Physical, chart, labs and discussed the procedure including the risks, benefits and alternatives for the proposed anesthesia with the patient or authorized representative who has indicated his/her understanding and acceptance.     Dental Advisory Given  Plan Discussed with: Anesthesiologist, CRNA and Surgeon  Anesthesia Plan Comments:          Anesthesia Quick Evaluation

## 2021-09-19 NOTE — Transfer of Care (Signed)
Immediate Anesthesia Transfer of Care Note  Patient: Victoria Barrett  Procedure(s) Performed: COLONOSCOPY WITH PROPOFOL  Patient Location: PACU and Endoscopy Unit  Anesthesia Type:General  Level of Consciousness: drowsy  Airway & Oxygen Therapy: Patient Spontanous Breathing  Post-op Assessment: Report given to RN and Post -op Vital signs reviewed and stable  Post vital signs: Reviewed and stable  Last Vitals:  Vitals Value Taken Time  BP 110/51 09/19/21 1158  Temp    Pulse 86 09/19/21 1158  Resp 22 09/19/21 1158  SpO2 100 % 09/19/21 1158  Vitals shown include unvalidated device data.  Last Pain:  Vitals:   09/19/21 1017  TempSrc: Temporal  PainSc: 0-No pain         Complications: No notable events documented.

## 2021-09-19 NOTE — Anesthesia Procedure Notes (Signed)
Date/Time: 09/19/2021 11:22 AM Performed by: Stormy Fabian, CRNA Pre-anesthesia Checklist: Patient identified, Emergency Drugs available, Suction available and Patient being monitored Patient Re-evaluated:Patient Re-evaluated prior to induction Oxygen Delivery Method: Nasal cannula Induction Type: IV induction Dental Injury: Teeth and Oropharynx as per pre-operative assessment  Comments: Nasal cannula with etCO2 monitoring

## 2021-09-19 NOTE — Op Note (Signed)
[facility] Patient Name: Victoria Barrett Procedure Date: 09/19/2021 11:08 AM MRN: [id number] Account #: [id number] Date of Birth: 04-07-1961 Admit Type: Outpatient Age: 61 Room: [facility] ENDO ROOM 1 Gender: Female Note Status: Finalized Instrument Name: Jasper Riling [id number] Procedure:             Colonoscopy Indications:           Screening for colorectal malignant neoplasm, Colon                         cancer screening in patient at increased risk: Family                         history of 1st-degree relative with colon polyps,                         Colon cancer screening in patient at increased risk:                         Family history of colon polyps in multiple 1st-degree                         relatives Providers:             Annamaria Helling DO, DO Referring MD:          Deborra Medina, MD (Referring MD) Medicines:             Monitored Anesthesia Care Complications:         No immediate complications. Estimated blood loss: None. Procedure:             Pre-Anesthesia Assessment:                        - Prior to the procedure, a History and Physical was                         performed, and patient medications and allergies were                         reviewed. The patient is competent. The risks and                         benefits of the procedure and the sedation options and                         risks were discussed with the patient. All questions                         were answered and informed consent was obtained.                         Patient identification and proposed procedure were                         verified by the physician, the nurse, the anesthetist                         and the technician in the endoscopy suite. Mental  Status Examination: alert and oriented. Airway                         Examination: normal oropharyngeal airway and neck                         mobility. Respiratory  Examination: clear to                         auscultation. CV Examination: RRR, no murmurs, no S3                         or S4. Prophylactic Antibiotics: The patient does not                         require prophylactic antibiotics. Prior                         Anticoagulants: The patient has taken no previous                         anticoagulant or antiplatelet agents. ASA Grade                         Assessment: I - A normal, healthy patient. After                         reviewing the risks and benefits, the patient was                         deemed in satisfactory condition to undergo the                         procedure. The anesthesia plan was to use monitored                         anesthesia care (MAC). Immediately prior to                         administration of medications, the patient was                         re-assessed for adequacy to receive sedatives. The                         heart rate, respiratory rate, oxygen saturations,                         blood pressure, adequacy of pulmonary ventilation, and                         response to care were monitored throughout the                         procedure. The physical status of the patient was                         re-assessed after the procedure.  After obtaining informed consent, the colonoscope was                         passed under direct vision. Throughout the procedure,                         the patient's blood pressure, pulse, and oxygen                         saturations were monitored continuously. The                         Colonoscope was introduced through the anus and                         advanced to the the cecum, identified by appendiceal                         orifice and ileocecal valve. The colonoscopy was                         performed without difficulty. The patient tolerated                         the procedure well. The quality of the bowel                          preparation was evaluated using the BBPS [facility] Bowel                         Preparation Scale) with scores of: Right Colon = 3,                         Transverse Colon = 3 and Left Colon = 3 (entire mucosa                         seen well with no residual staining, small fragments                         of stool or opaque liquid). The total BBPS score                         equals 9. The terminal ileum, ileocecal valve,                         appendiceal orifice, and rectum were photographed. Findings:      Skin tags were found on perianal exam.      The digital rectal exam was normal. Pertinent negatives include normal       sphincter tone.      A few small-mouthed diverticula were found in the recto-sigmoid colon       and sigmoid colon. Estimated blood loss: none.      Non-bleeding internal hemorrhoids were found during retroflexion. The       hemorrhoids were Grade I (internal hemorrhoids that do not prolapse).       Estimated blood loss: none.      The exam was otherwise without abnormality on direct and retroflexion  views. Impression:            - Perianal skin tags found on perianal exam.                        - Diverticulosis in the recto-sigmoid colon and in the                         sigmoid colon.                        - Non-bleeding internal hemorrhoids.                        - The examination was otherwise normal on direct and                         retroflexion views.                        - No specimens collected. Recommendation:        - Discharge patient to home.                        - Resume previous diet.                        - Continue present medications.                        - Repeat colonoscopy in 5 years for surveillance.                        - Return to referring physician as previously                         scheduled. Procedure Code(s):     --- Professional ---                        [phone], Colonoscopy, flexible;  diagnostic, including                         collection of specimen(s) by brushing or washing, when                         performed (separate procedure) Diagnosis Code(s):     --- Professional ---                        Z12.11, Encounter for screening for malignant neoplasm                         of colon                        Z83.71, Family history of colonic polyps                        K64.0, First degree hemorrhoids                        K64.4, Residual hemorrhoidal skin tags  K57.30, Diverticulosis of large intestine without                         perforation or abscess without bleeding CPT copyright 2019 American [employer] Association. All rights reserved. The codes documented in this report are preliminary and upon coder review may  be revised to meet current compliance requirements. Attending Participation:      I personally performed the entire procedure. Volney American, DO Annamaria Helling DO, DO 09/19/2021 12:05:31 PM This report has been signed electronically. Number of Addenda: 0 Note Initiated On: 09/19/2021 11:08 AM Scope Withdrawal Time: 0 hours 9 minutes 26 seconds  Total Procedure Duration: 0 hours 26 minutes 59 seconds  Estimated Blood Loss:  Estimated blood loss: none.      [facility]

## 2021-09-20 ENCOUNTER — Encounter: Payer: Self-pay | Admitting: Gastroenterology

## 2021-09-21 NOTE — Anesthesia Postprocedure Evaluation (Signed)
Anesthesia Post Note  Patient: Victoria Barrett  Procedure(s) Performed: COLONOSCOPY WITH PROPOFOL  Patient location during evaluation: Endoscopy Anesthesia Type: General Level of consciousness: awake and alert Pain management: pain level controlled Vital Signs Assessment: post-procedure vital signs reviewed and stable Respiratory status: spontaneous breathing, nonlabored ventilation, respiratory function stable and patient connected to nasal cannula oxygen Cardiovascular status: blood pressure returned to baseline and stable Postop Assessment: no apparent nausea or vomiting Anesthetic complications: no   No notable events documented.   Last Vitals:  Vitals:   09/19/21 1208 09/19/21 1219  BP: 110/76 (!) 127/57  Pulse: 77 73  Resp: 18 17  Temp:    SpO2: 100% 100%    Last Pain:  Vitals:   09/19/21 1219  TempSrc:   PainSc: 0-No pain                 Lenard Simmer

## 2021-11-06 ENCOUNTER — Other Ambulatory Visit: Payer: Self-pay | Admitting: Internal Medicine

## 2021-11-06 DIAGNOSIS — Z1231 Encounter for screening mammogram for malignant neoplasm of breast: Secondary | ICD-10-CM

## 2021-12-17 ENCOUNTER — Encounter: Payer: Self-pay | Admitting: Internal Medicine

## 2021-12-17 ENCOUNTER — Telehealth: Payer: Self-pay

## 2021-12-17 NOTE — Telephone Encounter (Signed)
LMTCB to pre chart for tomorrow's visit.  ?

## 2021-12-18 ENCOUNTER — Encounter: Payer: Self-pay | Admitting: Internal Medicine

## 2021-12-18 ENCOUNTER — Ambulatory Visit
Admission: RE | Admit: 2021-12-18 | Discharge: 2021-12-18 | Disposition: A | Discharge status: Home / Self Care | Payer: No Typology Code available for payment source | Source: Ambulatory Visit | Attending: Internal Medicine | Admitting: Internal Medicine

## 2021-12-18 ENCOUNTER — Ambulatory Visit (INDEPENDENT_AMBULATORY_CARE_PROVIDER_SITE_OTHER): Payer: No Typology Code available for payment source | Admitting: Internal Medicine

## 2021-12-18 ENCOUNTER — Ambulatory Visit (INDEPENDENT_AMBULATORY_CARE_PROVIDER_SITE_OTHER): Payer: No Typology Code available for payment source

## 2021-12-18 VITALS — BP 124/70 | HR 76 | Temp 98.1°F | Ht 66.0 in | Wt 170.0 lb

## 2021-12-18 DIAGNOSIS — E785 Hyperlipidemia, unspecified: Secondary | ICD-10-CM | POA: Diagnosis not present

## 2021-12-18 DIAGNOSIS — M21612 Bunion of left foot: Secondary | ICD-10-CM

## 2021-12-18 DIAGNOSIS — E663 Overweight: Secondary | ICD-10-CM

## 2021-12-18 DIAGNOSIS — R7301 Impaired fasting glucose: Secondary | ICD-10-CM

## 2021-12-18 DIAGNOSIS — Z1231 Encounter for screening mammogram for malignant neoplasm of breast: Secondary | ICD-10-CM | POA: Diagnosis present

## 2021-12-18 DIAGNOSIS — M21611 Bunion of right foot: Secondary | ICD-10-CM

## 2021-12-18 DIAGNOSIS — R5383 Other fatigue: Secondary | ICD-10-CM | POA: Diagnosis not present

## 2021-12-18 DIAGNOSIS — L608 Other nail disorders: Secondary | ICD-10-CM

## 2021-12-18 DIAGNOSIS — Z0000 Encounter for general adult medical examination without abnormal findings: Secondary | ICD-10-CM | POA: Diagnosis not present

## 2021-12-18 DIAGNOSIS — L918 Other hypertrophic disorders of the skin: Secondary | ICD-10-CM

## 2021-12-18 LAB — COMPREHENSIVE METABOLIC PANEL
ALT: 19 U/L (ref 0–35)
AST: 18 U/L (ref 0–37)
Albumin: 4.2 g/dL (ref 3.5–5.2)
Alkaline Phosphatase: 54 U/L (ref 39–117)
BUN: 18 mg/dL (ref 6–23)
CO2: 30 mEq/L (ref 19–32)
Calcium: 9.9 mg/dL (ref 8.4–10.5)
Chloride: 104 mEq/L (ref 96–112)
Creatinine, Ser: 0.7 mg/dL (ref 0.40–1.20)
GFR: 94.22 mL/min (ref >60.00)
Glucose, Bld: 85 mg/dL (ref 70–99)
Potassium: 4.6 mEq/L (ref 3.5–5.1)
Sodium: 142 mEq/L (ref 135–145)
Total Bilirubin: 0.4 mg/dL (ref 0.2–1.2)
Total Protein: 7 g/dL (ref 6.0–8.3)

## 2021-12-18 LAB — TSH: TSH: 2.6 u[IU]/mL (ref 0.35–5.50)

## 2021-12-18 LAB — LIPID PANEL
Cholesterol: 205 mg/dL — ABNORMAL HIGH (ref 0–200)
HDL: 56.6 mg/dL
LDL Cholesterol: 114 mg/dL — ABNORMAL HIGH (ref 0–99)
NonHDL: 148.74
Total CHOL/HDL Ratio: 4
Triglycerides: 174 mg/dL — ABNORMAL HIGH (ref 0.0–149.0)
VLDL: 34.8 mg/dL (ref 0.0–40.0)

## 2021-12-18 LAB — CBC WITH DIFFERENTIAL/PLATELET
Basophils Absolute: 0.1 10*3/uL (ref 0.0–0.1)
Basophils Relative: 2.1 % (ref 0.0–3.0)
Eosinophils Absolute: 0.2 10*3/uL (ref 0.0–0.7)
Eosinophils Relative: 4.6 % (ref 0.0–5.0)
HCT: 41.5 % (ref 36.0–46.0)
Hemoglobin: 13.5 g/dL (ref 12.0–15.0)
Lymphocytes Relative: 29 % (ref 12.0–46.0)
Lymphs Abs: 1.6 10*3/uL (ref 0.7–4.0)
MCHC: 32.6 g/dL (ref 30.0–36.0)
MCV: 92.3 fl (ref 78.0–100.0)
Monocytes Absolute: 0.4 10*3/uL (ref 0.1–1.0)
Monocytes Relative: 8.2 % (ref 3.0–12.0)
Neutro Abs: 3.1 10*3/uL (ref 1.4–7.7)
Neutrophils Relative %: 56.1 % (ref 43.0–77.0)
Platelets: 221 10*3/uL (ref 150.0–400.0)
RBC: 4.49 Mil/uL (ref 3.87–5.11)
RDW: 14.4 % (ref 11.5–15.5)
WBC: 5.4 10*3/uL (ref 4.0–10.5)

## 2021-12-18 LAB — HEMOGLOBIN A1C: Hgb A1c MFr Bld: 5.8 % (ref 4.6–6.5)

## 2021-12-18 LAB — LDL CHOLESTEROL, DIRECT: Direct LDL: 135 mg/dL

## 2021-12-18 NOTE — Assessment & Plan Note (Signed)
I have addressed  BMI and recommended a low glycemic index diet utilizing smaller more frequent meals to increase metabolism.  I have also recommended that patient increase her exercise to 5 days  of 30 minutes of aerobic exercise . Screening for lipid disorders, thyroid and diabetes to be done today. ? ? ?

## 2021-12-18 NOTE — Progress Notes (Signed)
The patient is here for annual preventive examination and management of other chronic and acute problems. ?  ?The risk factors are reflected in the social history. ?  ?The roster of all physicians providing medical care to patient - is listed in the Snapshot section of the chart. ?  ?Activities of daily living:  The patient is 100% independent in all ADLs: dressing, toileting, feeding as well as independent mobility ?  ?Home safety : The patient has smoke detectors in the home. They wear seatbelts.  There are no unsecured firearms at home. There is no violence in the home.  ?  ?There is no risks for hepatitis, STDs or HIV. There is no   history of blood transfusion. They have no travel history to infectious disease endemic areas of the world. ?  ?The patient has seen their dentist in the last six month. They have seen their eye doctor in the last year. The patinet  denies slight hearing difficulty with regard to whispered voices and some television programs.  They have deferred audiologic testing in the last year.  They do not  have excessive sun exposure. Discussed the need for sun protection: hats, long sleeves and use of sunscreen if there is significant sun exposure.  ?  ?Diet: the importance of a healthy diet is discussed. They do have a healthy diet. ?  ?The benefits of regular aerobic exercise were discussed. The patient  has fully recovered from bunionectomy of right foot done in June .  Waiting for custom orthotics. Walking  3   days per week  for  60 minutes.  ?  ?Depression screen: there are no signs or vegative symptoms of depression- irritability, change in appetite, anhedonia, sadness/tearfullness. ?  ?The following portions of the patient's history were reviewed and updated as appropriate: allergies, current medications, past family history, past medical history,  past surgical history, past social history  and problem list. ?  ?Visual acuity was not assessed per patient preference since the patient  has regular follow up with an  ophthalmologist. Hearing and body mass index were assessed and reviewed.  ?  ?During the course of the visit the patient was educated and counseled about appropriate screening and preventive services including : fall prevention , diabetes screening, nutrition counseling, colorectal cancer screening, and recommended immunizations.   ? ?Chief Complaint: ? ?No complaints.  ? ? ? ?Review of Symptoms ? ?Patient denies headache, fevers, malaise, unintentional weight loss, skin rash, eye pain, sinus congestion and sinus pain, sore throat, dysphagia,  hemoptysis , cough, dyspnea, wheezing, chest pain, palpitations, orthopnea, edema, abdominal pain, nausea, melena, diarrhea, constipation, flank pain, dysuria, hematuria, urinary  Frequency, nocturia, numbness, tingling, seizures,  Focal weakness, Loss of consciousness,  Tremor, insomnia, depression, anxiety, and suicidal ideation.   ? ?Physical Exam: ? ?BP 124/70 (BP Location: Left Arm, Patient Position: Sitting, Cuff Size: Small)   Pulse 76   Temp 98.1 ?F (36.7 ?C) (Temporal)   Ht 5\' 6"  (1.676 m)   Wt 170 lb (77.1 kg)   LMP 05/27/2017   SpO2 98%   BMI 27.44 kg/m?   ? ?General appearance: alert, cooperative and appears stated age ?Head: Normocephalic, without obvious abnormality, atraumatic ?Eyes: conjunctivae/corneas clear. PERRL, EOM's intact. Fundi benign. ?Ears: normal TM's and external ear canals both ears ?Nose: Nares normal. Septum midline. Mucosa normal. No drainage or sinus tenderness. ?Throat: lips, mucosa, and tongue normal; teeth and gums normal ?Neck: no adenopathy, no carotid bruit, no JVD, supple, symmetrical, trachea midline  and thyroid not enlarged, symmetric, no tenderness/mass/nodules ?Lungs: clear to auscultation bilaterally ?Breasts: normal appearance, no masses or tenderness ?Heart: regular rate and rhythm, S1, S2 normal, no murmur, click, rub or gallop ?Abdomen: soft, non-tender; bowel sounds normal; no masses,  no  organomegaly ?Extremities: extremities normal, atraumatic, no cyanosis or edema ?Pulses: 2+ and symmetric ?Skin: Skin color, texture, turgor normal. No rashes or lesions ?Neurologic: Alert and oriented X 3, normal strength and tone. Normal symmetric reflexes. Normal coordination and gait.    ? ?Assessment and Plan: ? ?Overweight (BMI 25.0-29.9) ?I have addressed  BMI and recommended a low glycemic index diet utilizing smaller more frequent meals to increase metabolism.  I have also recommended that patient increase her exercise to 5 days  of 30 minutes of aerobic exercise . Screening for lipid disorders, thyroid and diabetes to be done today. ? ? ? ?Visit for preventive health examination ?age appropriate education and counseling updated, referrals for preventative services and immunizations addressed, dietary and smoking counseling addressed, most recent labs reviewed.  I have personally reviewed and have noted: ?  ?1) the patient's medical and social history ?2) The pt's use of alcohol, tobacco, and illicit drugs ?3) The patient's current medications and supplements ?4) Functional ability including ADL's, fall risk, home safety risk, hearing and visual impairment ?5) Diet and physical activities ?6) Evidence for depression or mood disorder ?7) The patient's height, weight, and BMI have been recorded in the chart ?   ?  ?I have made referrals, and provided counseling and education based on review of the above ? ?Bilateral bunions ?S/p bunionectomy of right foot July 2022 by podiatry .  Good results.   ? ? ?Updated Medication List ?Outpatient Encounter Medications as of 12/18/2021  ?Medication Sig  ? diptheria-tetanus toxoids (TDVAX) 2-2 LF/0.5ML injection Inject into the muscle.  ? docusate sodium (COLACE) 250 MG capsule Take 250 mg by mouth daily.  ? loratadine (CLARITIN) 10 MG tablet Take 10 mg by mouth daily as needed.  ? Multiple Vitamins-Minerals (MULTIVITAMIN WITH MINERALS) tablet Take 1 tablet by mouth daily.   ? naproxen sodium (ANAPROX) 220 MG tablet Take 220 mg by mouth as needed.   ? Zoster Vaccine Adjuvanted [facility]) injection Inject into the muscle.  ? bisacodyl 5 MG EC tablet Take 1 tablet (5 mg total) by mouth as directed (For colon prep) for up to 30 days (Patient not taking: Reported on 12/18/2021)  ? ondansetron (ZOFRAN) 4 MG tablet Take 1 tablet (4 mg total) by mouth every 8 (eight) hours as needed for nausea or vomiting. (Patient not taking: Reported on 12/18/2021)  ? polyethylene glycol powder (GLYCOLAX/MIRALAX) 17 GM/SCOOP powder Take 17 g by mouth as directed (For colon prep) for up to 1 day Mix entire bottle in 64oz of Gatorade for colon prep. (Patient not taking: Reported on 12/18/2021)  ? ?No facility-administered encounter medications on file as of 12/18/2021.  ? ? ?

## 2021-12-18 NOTE — Patient Instructions (Signed)
Your annual mammogram has been ordered.  You are encouraged (required) to call to make your appointment at [place]  [phone]  ° ° °

## 2021-12-18 NOTE — Assessment & Plan Note (Signed)

## 2021-12-18 NOTE — Assessment & Plan Note (Signed)
S/p bunionectomy of right foot July 2022 by podiatry .  Good results.   ?

## 2022-07-21 ENCOUNTER — Other Ambulatory Visit: Payer: Self-pay

## 2022-07-22 ENCOUNTER — Other Ambulatory Visit: Payer: Self-pay

## 2022-07-22 MED ORDER — ZOSTER VAC RECOMB ADJUVANTED 50 MCG/0.5ML IM SUSR
INTRAMUSCULAR | 1 refills | Status: DC
  Filled 2022-07-22: qty 0.5, 1d supply, fill #0
  Filled 2022-10-22: qty 0.5, 1d supply, fill #1

## 2022-10-14 ENCOUNTER — Other Ambulatory Visit: Payer: Self-pay | Admitting: Internal Medicine

## 2022-10-14 DIAGNOSIS — Z1231 Encounter for screening mammogram for malignant neoplasm of breast: Secondary | ICD-10-CM

## 2022-10-20 ENCOUNTER — Other Ambulatory Visit: Payer: Self-pay

## 2022-10-22 ENCOUNTER — Other Ambulatory Visit: Payer: Self-pay

## 2022-10-22 DIAGNOSIS — L578 Other skin changes due to chronic exposure to nonionizing radiation: Secondary | ICD-10-CM | POA: Diagnosis not present

## 2022-10-22 DIAGNOSIS — D2271 Melanocytic nevi of right lower limb, including hip: Secondary | ICD-10-CM | POA: Diagnosis not present

## 2022-10-22 DIAGNOSIS — D225 Melanocytic nevi of trunk: Secondary | ICD-10-CM | POA: Diagnosis not present

## 2022-10-22 DIAGNOSIS — Z86006 Personal history of melanoma in-situ: Secondary | ICD-10-CM | POA: Diagnosis not present

## 2022-10-22 DIAGNOSIS — D2262 Melanocytic nevi of left upper limb, including shoulder: Secondary | ICD-10-CM | POA: Diagnosis not present

## 2022-10-22 DIAGNOSIS — D2261 Melanocytic nevi of right upper limb, including shoulder: Secondary | ICD-10-CM | POA: Diagnosis not present

## 2022-12-22 ENCOUNTER — Encounter: Payer: Self-pay | Admitting: Internal Medicine

## 2022-12-22 ENCOUNTER — Other Ambulatory Visit (HOSPITAL_COMMUNITY)
Admission: RE | Admit: 2022-12-22 | Discharge: 2022-12-22 | Disposition: A | Discharge status: Home / Self Care | Payer: 59 | Source: Ambulatory Visit | Attending: Internal Medicine | Admitting: Internal Medicine

## 2022-12-22 ENCOUNTER — Ambulatory Visit
Admission: RE | Admit: 2022-12-22 | Discharge: 2022-12-22 | Disposition: A | Discharge status: Home / Self Care | Payer: 59 | Source: Ambulatory Visit | Attending: Internal Medicine | Admitting: Internal Medicine

## 2022-12-22 ENCOUNTER — Ambulatory Visit (INDEPENDENT_AMBULATORY_CARE_PROVIDER_SITE_OTHER): Payer: 59 | Admitting: Internal Medicine

## 2022-12-22 VITALS — BP 106/74 | HR 78 | Temp 98.1°F | Ht 66.0 in | Wt 171.4 lb

## 2022-12-22 DIAGNOSIS — R7301 Impaired fasting glucose: Secondary | ICD-10-CM

## 2022-12-22 DIAGNOSIS — Z0000 Encounter for general adult medical examination without abnormal findings: Secondary | ICD-10-CM | POA: Diagnosis not present

## 2022-12-22 DIAGNOSIS — E559 Vitamin D deficiency, unspecified: Secondary | ICD-10-CM

## 2022-12-22 DIAGNOSIS — R5383 Other fatigue: Secondary | ICD-10-CM

## 2022-12-22 DIAGNOSIS — E785 Hyperlipidemia, unspecified: Secondary | ICD-10-CM

## 2022-12-22 DIAGNOSIS — Z124 Encounter for screening for malignant neoplasm of cervix: Secondary | ICD-10-CM | POA: Diagnosis not present

## 2022-12-22 DIAGNOSIS — Z1231 Encounter for screening mammogram for malignant neoplasm of breast: Secondary | ICD-10-CM | POA: Diagnosis not present

## 2022-12-22 LAB — CBC WITH DIFFERENTIAL/PLATELET
Basophils Absolute: 0.1 10*3/uL (ref 0.0–0.1)
Basophils Relative: 1.2 % (ref 0.0–3.0)
Eosinophils Absolute: 0.2 10*3/uL (ref 0.0–0.7)
Eosinophils Relative: 3.5 % (ref 0.0–5.0)
HCT: 40.6 % (ref 36.0–46.0)
Hemoglobin: 13.7 g/dL (ref 12.0–15.0)
Lymphocytes Relative: 31.3 % (ref 12.0–46.0)
Lymphs Abs: 1.8 10*3/uL (ref 0.7–4.0)
MCHC: 33.7 g/dL (ref 30.0–36.0)
MCV: 90.7 fl (ref 78.0–100.0)
Monocytes Absolute: 0.5 10*3/uL (ref 0.1–1.0)
Monocytes Relative: 8.4 % (ref 3.0–12.0)
Neutro Abs: 3.2 10*3/uL (ref 1.4–7.7)
Neutrophils Relative %: 55.6 % (ref 43.0–77.0)
Platelets: 225 10*3/uL (ref 150.0–400.0)
RBC: 4.47 Mil/uL (ref 3.87–5.11)
RDW: 14.4 % (ref 11.5–15.5)
WBC: 5.8 10*3/uL (ref 4.0–10.5)

## 2022-12-22 LAB — LIPID PANEL
Cholesterol: 211 mg/dL — ABNORMAL HIGH (ref 0–200)
HDL: 52.1 mg/dL (ref >39.00)
LDL Cholesterol: 125 mg/dL — ABNORMAL HIGH (ref 0–99)
NonHDL: 158.49
Total CHOL/HDL Ratio: 4
Triglycerides: 168 mg/dL — ABNORMAL HIGH (ref 0.0–149.0)
VLDL: 33.6 mg/dL (ref 0.0–40.0)

## 2022-12-22 LAB — COMPREHENSIVE METABOLIC PANEL
ALT: 21 U/L (ref 0–35)
AST: 20 U/L (ref 0–37)
Albumin: 4.2 g/dL (ref 3.5–5.2)
Alkaline Phosphatase: 60 U/L (ref 39–117)
BUN: 17 mg/dL (ref 6–23)
CO2: 29 mEq/L (ref 19–32)
Calcium: 10.1 mg/dL (ref 8.4–10.5)
Chloride: 102 mEq/L (ref 96–112)
Creatinine, Ser: 0.7 mg/dL (ref 0.40–1.20)
GFR: 93.55 mL/min (ref >60.00)
Glucose, Bld: 83 mg/dL (ref 70–99)
Potassium: 4.1 mEq/L (ref 3.5–5.1)
Sodium: 140 mEq/L (ref 135–145)
Total Bilirubin: 0.6 mg/dL (ref 0.2–1.2)
Total Protein: 6.8 g/dL (ref 6.0–8.3)

## 2022-12-22 LAB — VITAMIN D 25 HYDROXY (VIT D DEFICIENCY, FRACTURES): VITD: 67.14 ng/mL (ref 30.00–100.00)

## 2022-12-22 LAB — LDL CHOLESTEROL, DIRECT: Direct LDL: 137 mg/dL

## 2022-12-22 LAB — TSH: TSH: 1.74 u[IU]/mL (ref 0.35–5.50)

## 2022-12-22 LAB — HEMOGLOBIN A1C: Hgb A1c MFr Bld: 5.7 % (ref 4.6–6.5)

## 2022-12-22 NOTE — Progress Notes (Signed)
Patient ID: Victoria Barrett, female    DOB: [birth year]  Age: 62 y.o. MRN: [id number]  The patient is here for annual preventive examination and management of other chronic and acute problems.   The risk factors are reflected in the social history.   The roster of all physicians providing medical care to patient - is listed in the Snapshot section of the chart.   Activities of daily living:  The patient is 100% independent in all ADLs: dressing, toileting, feeding as well as independent mobility   Home safety : The patient has smoke detectors in the home. They wear seatbelts.  There are no unsecured firearms at home. There is no violence in the home.    There is no risks for hepatitis, STDs or HIV. There is no   history of blood transfusion. They have no travel history to infectious disease endemic areas of the world.   The patient has seen their dentist in the last six month. They have seen their eye doctor in the last year. The patinet  denies slight hearing difficulty with regard to whispered voices and some television programs.  They have deferred audiologic testing in the last year.  They do not  have excessive sun exposure. Discussed the need for sun protection: hats, long sleeves and use of sunscreen if there is significant sun exposure.    Diet: the importance of a healthy diet is discussed. They do have a healthy diet.   The benefits of regular aerobic exercise were discussed. The patient  exercises  3 to 5 days per week  for  60 minutes.    Depression screen: there are no signs or vegative symptoms of depression- irritability, change in appetite, anhedonia, sadness/tearfullness.   The following portions of the patient's history were reviewed and updated as appropriate: allergies, current medications, past family history, past medical history,  past surgical history, past social history  and problem list.   Visual acuity was not assessed per patient preference since the patient has  regular follow up with an  ophthalmologist. Hearing and body mass index were assessed and reviewed.    During the course of the visit the patient was educated and counseled about appropriate screening and preventive services including : fall prevention , diabetes screening, nutrition counseling, colorectal cancer screening, and recommended immunizations.    Chief Complaint:    None/   Review of Symptoms  Patient denies headache, fevers, malaise, unintentional weight loss, skin rash, eye pain, sinus congestion and sinus pain, sore throat, dysphagia,  hemoptysis , cough, dyspnea, wheezing, chest pain, palpitations, orthopnea, edema, abdominal pain, nausea, melena, diarrhea, constipation, flank pain, dysuria, hematuria, urinary  Frequency, nocturia, numbness, tingling, seizures,  Focal weakness, Loss of consciousness,  Tremor, insomnia, depression, anxiety, and suicidal ideation.    Physical Exam:  BP 106/74   Pulse 78   Temp 98.1 F (36.7 C) (Oral)   Ht  (1.676 m)   Wt 171 lb 6.4 oz (77.7 kg)   LMP 05/27/2017   SpO2 97%   BMI 27.66 kg/m    Physical Exam Vitals reviewed.  Constitutional:      General: She is not in acute distress.    Appearance: Normal appearance. She is well-developed and normal weight. She is not ill-appearing, toxic-appearing or diaphoretic.  HENT:     Head: Normocephalic.     Right Ear: Tympanic membrane, ear canal and external ear normal. There is no impacted cerumen.     Left Ear: Tympanic membrane, ear  canal and external ear normal. There is no impacted cerumen.     Nose: Nose normal.     Mouth/Throat:     Mouth: Mucous membranes are moist.     Pharynx: Oropharynx is clear.  Eyes:     General: No scleral icterus.       Right eye: No discharge.        Left eye: No discharge.     Conjunctiva/sclera: Conjunctivae normal.     Pupils: Pupils are equal, round, and reactive to light.  Neck:     Thyroid: No thyromegaly.     Vascular: No carotid bruit  or JVD.  Cardiovascular:     Rate and Rhythm: Normal rate and regular rhythm.     Heart sounds: Normal heart sounds.  Pulmonary:     Effort: Pulmonary effort is normal. No respiratory distress.     Breath sounds: Normal breath sounds.  Chest:  Breasts:    Breasts are symmetrical.     Right: Normal. No swelling, inverted nipple, mass, nipple discharge, skin change or tenderness.     Left: Normal. No swelling, inverted nipple, mass, nipple discharge, skin change or tenderness.  Abdominal:     General: Bowel sounds are normal.     Palpations: Abdomen is soft. There is no mass.     Tenderness: There is no abdominal tenderness. There is no guarding or rebound.  Musculoskeletal:        General: Normal range of motion.     Cervical back: Normal range of motion and neck supple.  Lymphadenopathy:     Cervical: No cervical adenopathy.     Upper Body:     Right upper body: No supraclavicular, axillary or pectoral adenopathy.     Left upper body: No supraclavicular, axillary or pectoral adenopathy.  Skin:    General: Skin is warm and dry.  Neurological:     General: No focal deficit present.     Mental Status: She is alert and oriented to person, place, and time. Mental status is at baseline.  Psychiatric:        Mood and Affect: Mood normal.        Behavior: Behavior normal.        Thought Content: Thought content normal.        Judgment: Judgment normal.     Assessment and Plan: Cervical cancer screening -     Cytology - PAP  Visit for preventive health examination Assessment & Plan: age appropriate education and counseling updated, referrals for preventative services and immunizations addressed, dietary and smoking counseling addressed, most recent labs reviewed.  I have personally reviewed and have noted:   1) the patient's medical and social history 2) The pt's use of alcohol, tobacco, and illicit drugs 3) The patient's current medications and supplements 4) Functional  ability including ADL's, fall risk, home safety risk, hearing and visual impairment 5) Diet and physical activities 6) Evidence for depression or mood disorder 7) The patient's height, weight, and BMI have been recorded in the chart    I have made referrals, and provided counseling and education based on review of the above    Impaired fasting glucose -     Comprehensive metabolic panel -     Hemoglobin A1c  Hyperlipidemia, unspecified hyperlipidemia type -     Lipid panel -     LDL cholesterol, direct  Other fatigue -     CBC with Differential/Platelet -     TSH  Vitamin D deficiency -  VITAMIN D 25 Hydroxy (Vit-D Deficiency, Fractures)    Return in about 1 year (around 12/22/2023).  Sherlene Shams, MD

## 2022-12-22 NOTE — Assessment & Plan Note (Signed)

## 2022-12-24 LAB — CYTOLOGY - PAP
Comment: NEGATIVE
Diagnosis: NEGATIVE
High risk HPV: NEGATIVE

## 2023-04-13 ENCOUNTER — Other Ambulatory Visit (HOSPITAL_BASED_OUTPATIENT_CLINIC_OR_DEPARTMENT_OTHER): Payer: Self-pay

## 2023-04-23 ENCOUNTER — Other Ambulatory Visit: Payer: Self-pay

## 2023-04-23 DIAGNOSIS — L578 Other skin changes due to chronic exposure to nonionizing radiation: Secondary | ICD-10-CM | POA: Diagnosis not present

## 2023-04-23 DIAGNOSIS — Z86006 Personal history of melanoma in-situ: Secondary | ICD-10-CM | POA: Diagnosis not present

## 2023-04-23 DIAGNOSIS — D2272 Melanocytic nevi of left lower limb, including hip: Secondary | ICD-10-CM | POA: Diagnosis not present

## 2023-04-23 DIAGNOSIS — L821 Other seborrheic keratosis: Secondary | ICD-10-CM | POA: Diagnosis not present

## 2023-04-23 DIAGNOSIS — B0089 Other herpesviral infection: Secondary | ICD-10-CM | POA: Diagnosis not present

## 2023-04-23 DIAGNOSIS — D225 Melanocytic nevi of trunk: Secondary | ICD-10-CM | POA: Diagnosis not present

## 2023-04-23 DIAGNOSIS — D2261 Melanocytic nevi of right upper limb, including shoulder: Secondary | ICD-10-CM | POA: Diagnosis not present

## 2023-04-23 MED ORDER — VALACYCLOVIR HCL 1 G PO TABS
1000.0000 mg | ORAL_TABLET | Freq: Every day | ORAL | 3 refills | Status: DC
  Filled 2023-04-23: qty 30, 30d supply, fill #0
  Filled 2023-05-25: qty 30, 30d supply, fill #1
  Filled 2023-06-24: qty 30, 30d supply, fill #2
  Filled 2023-12-22: qty 30, 30d supply, fill #3

## 2023-05-12 ENCOUNTER — Other Ambulatory Visit: Payer: Self-pay

## 2023-05-15 DIAGNOSIS — M21622 Bunionette of left foot: Secondary | ICD-10-CM | POA: Diagnosis not present

## 2023-05-15 DIAGNOSIS — Q66222 Congenital metatarsus adductus, left foot: Secondary | ICD-10-CM | POA: Diagnosis not present

## 2023-05-15 DIAGNOSIS — M249 Joint derangement, unspecified: Secondary | ICD-10-CM | POA: Diagnosis not present

## 2023-05-15 DIAGNOSIS — M2042 Other hammer toe(s) (acquired), left foot: Secondary | ICD-10-CM | POA: Diagnosis not present

## 2023-05-15 DIAGNOSIS — M79672 Pain in left foot: Secondary | ICD-10-CM | POA: Diagnosis not present

## 2023-05-15 DIAGNOSIS — M216X2 Other acquired deformities of left foot: Secondary | ICD-10-CM | POA: Diagnosis not present

## 2023-05-15 DIAGNOSIS — Q66221 Congenital metatarsus adductus, right foot: Secondary | ICD-10-CM | POA: Diagnosis not present

## 2023-05-15 DIAGNOSIS — M2012 Hallux valgus (acquired), left foot: Secondary | ICD-10-CM | POA: Diagnosis not present

## 2023-06-26 DIAGNOSIS — Q66222 Congenital metatarsus adductus, left foot: Secondary | ICD-10-CM | POA: Diagnosis not present

## 2023-06-26 DIAGNOSIS — M2042 Other hammer toe(s) (acquired), left foot: Secondary | ICD-10-CM | POA: Diagnosis not present

## 2023-06-26 DIAGNOSIS — M21622 Bunionette of left foot: Secondary | ICD-10-CM | POA: Diagnosis not present

## 2023-06-26 DIAGNOSIS — M216X2 Other acquired deformities of left foot: Secondary | ICD-10-CM | POA: Diagnosis not present

## 2023-06-26 DIAGNOSIS — M79672 Pain in left foot: Secondary | ICD-10-CM | POA: Diagnosis not present

## 2023-06-26 DIAGNOSIS — M249 Joint derangement, unspecified: Secondary | ICD-10-CM | POA: Diagnosis not present

## 2023-06-26 DIAGNOSIS — M2012 Hallux valgus (acquired), left foot: Secondary | ICD-10-CM | POA: Diagnosis not present

## 2023-06-26 DIAGNOSIS — Q66221 Congenital metatarsus adductus, right foot: Secondary | ICD-10-CM | POA: Diagnosis not present

## 2023-06-29 ENCOUNTER — Other Ambulatory Visit: Payer: Self-pay | Admitting: Podiatry

## 2023-07-06 IMAGING — MG MM DIGITAL SCREENING BILAT W/ TOMO AND CAD
8 series · 8 of 24 positions shown · non-contrast
Comparison: Previous exam(s).

CLINICAL DATA: Screening.

EXAM:
DIGITAL SCREENING BILATERAL MAMMOGRAM WITH TOMOSYNTHESIS AND CAD
TECHNIQUE: Bilateral screening digital craniocaudal and mediolateral oblique
mammograms were obtained. Bilateral screening digital breast
tomosynthesis was performed. The images were evaluated with
computer-aided detection.

[L MLO synth-2D]
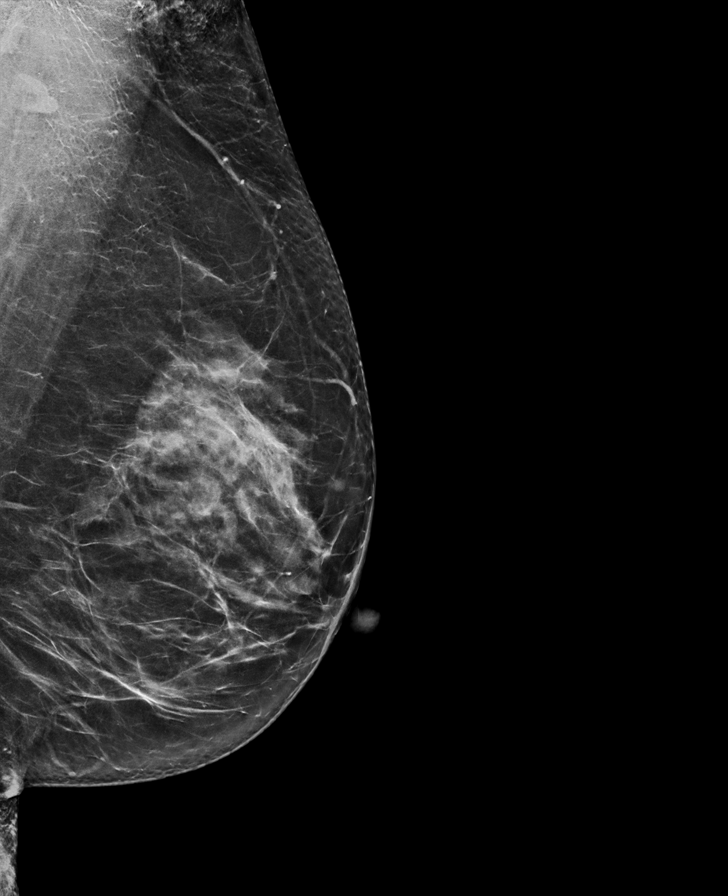

[R CC synth-2D]
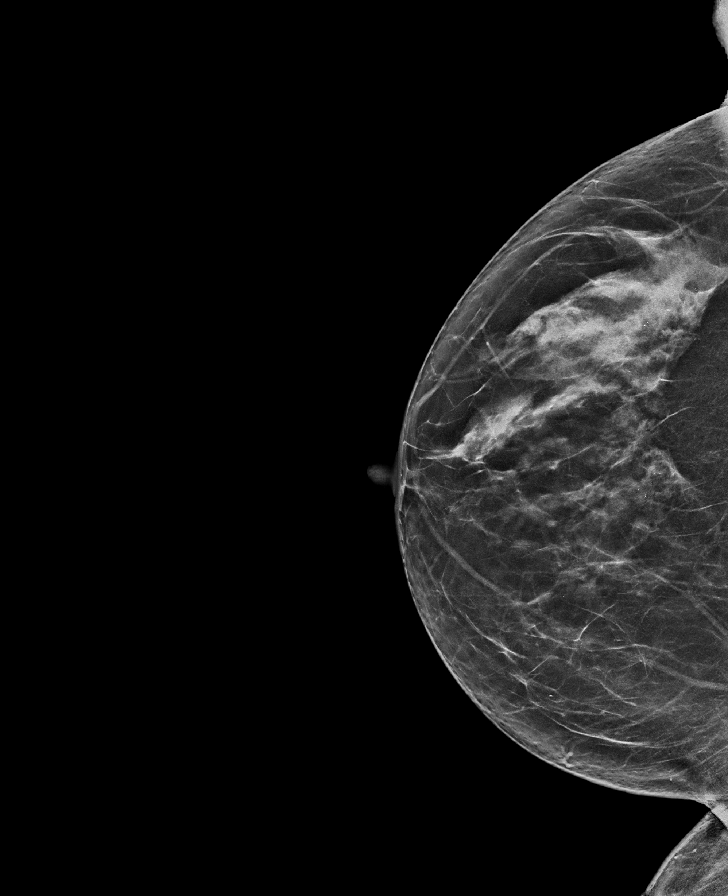

[R MLO synth-2D]
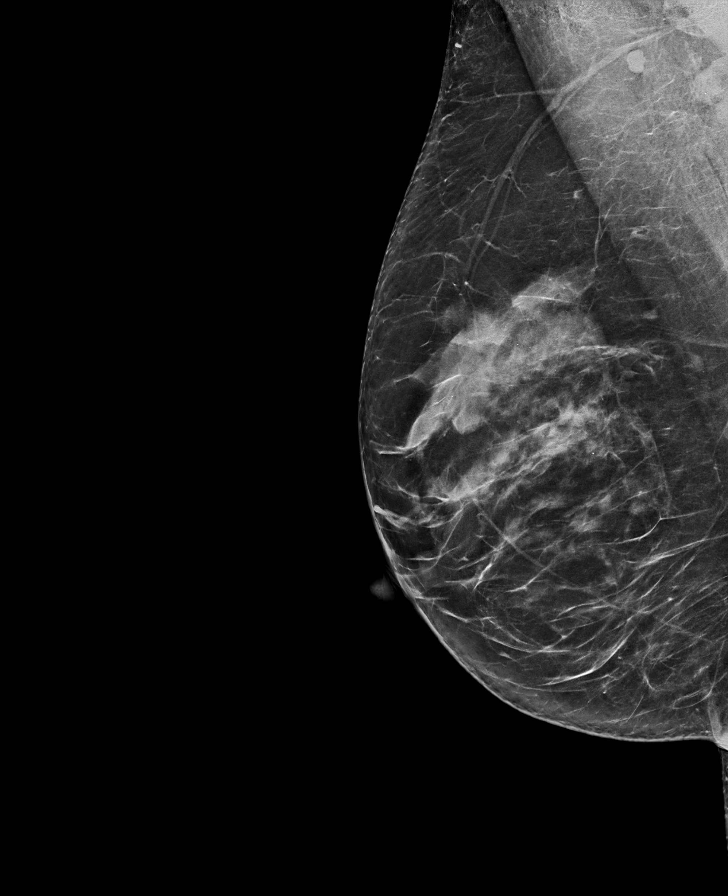

[L CC synth-2D]
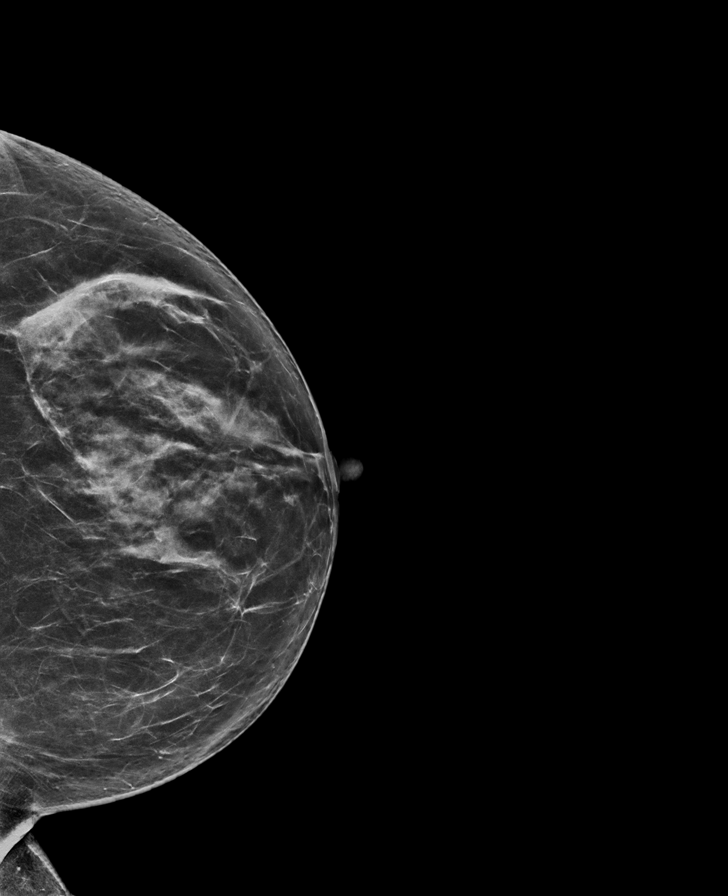

[L CC tomo · tomo slice 42/83.0]
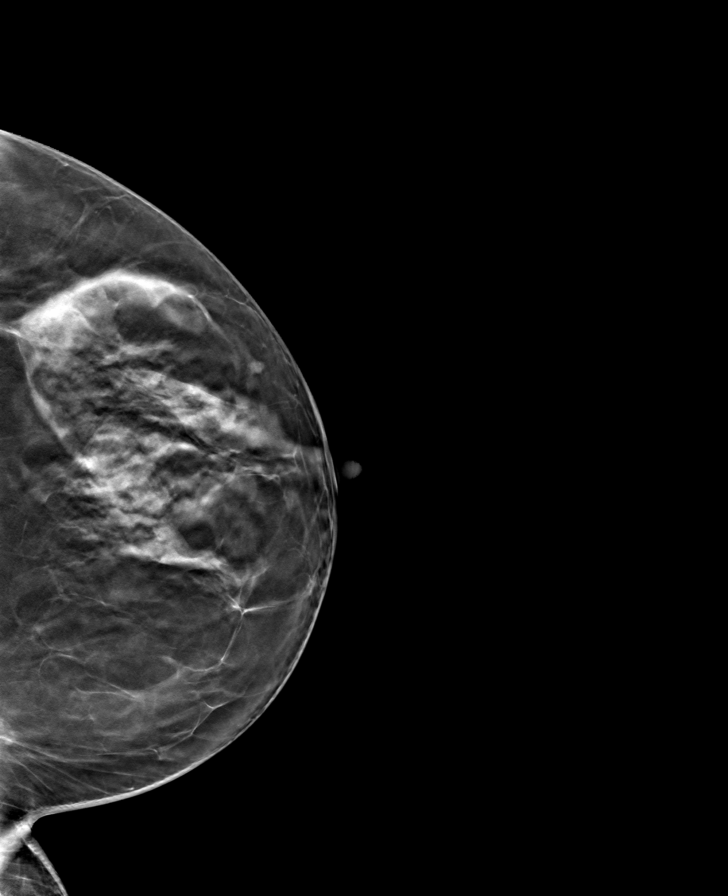

[L MLO tomo · tomo slice 43/85.0]
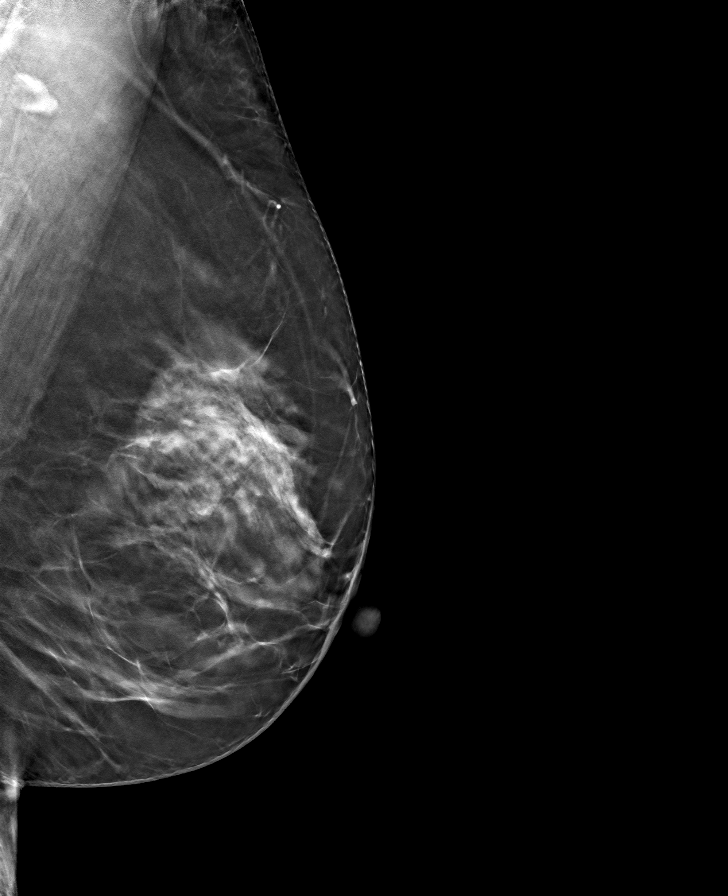

[R CC tomo · tomo slice 41/80.0]
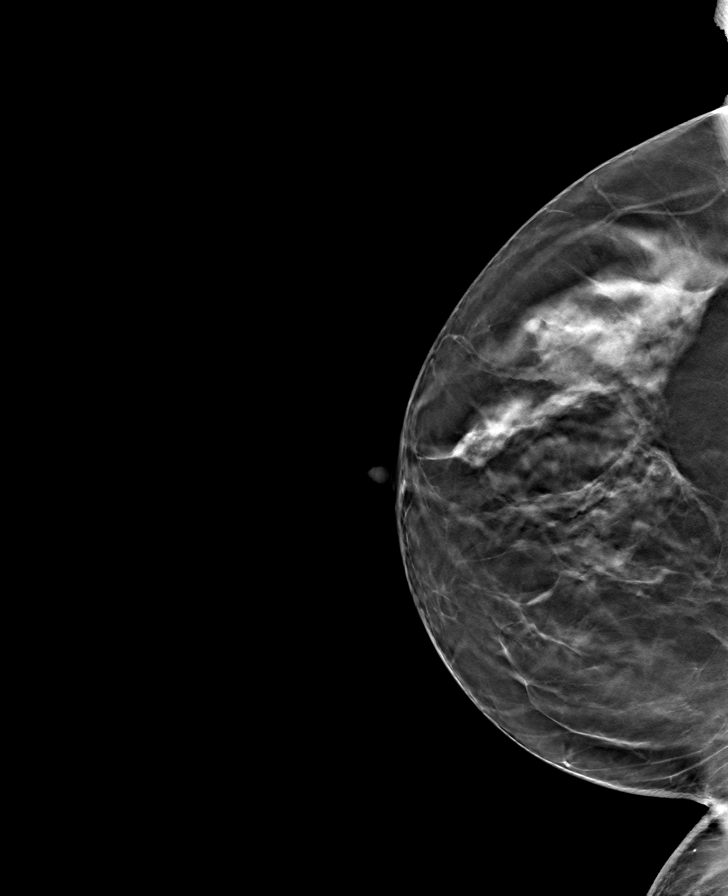

[R MLO tomo · tomo slice 43/85.0]
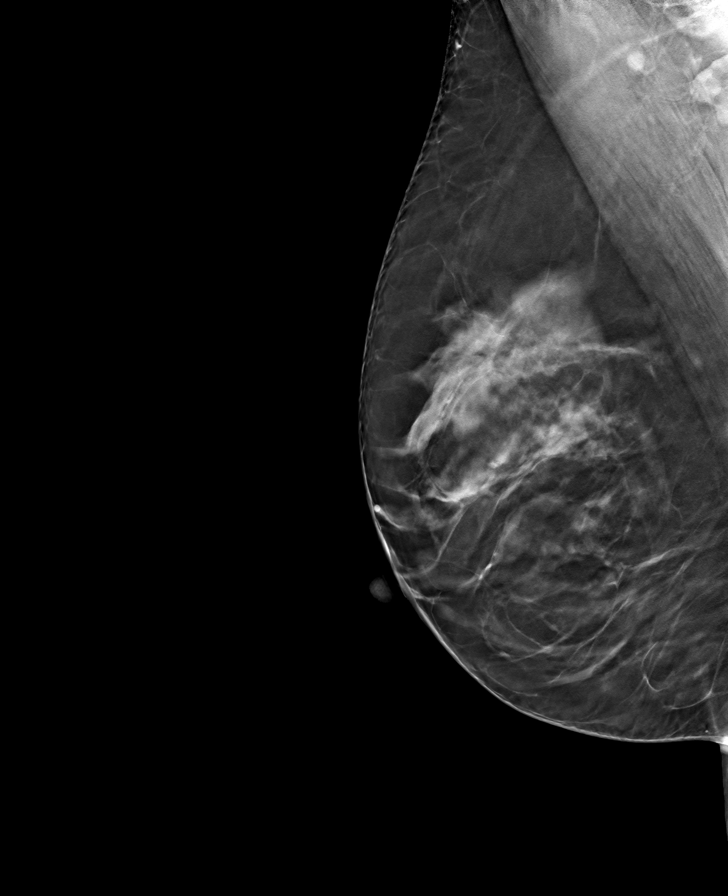

[8 of 24 positions shown; findings below may reference images not displayed]

ACR Breast Density Category c: The breast tissue is heterogeneously
dense, which may obscure small masses.
FINDINGS: There are no findings suspicious for malignancy.
IMPRESSION: No mammographic evidence of malignancy. A result letter of this
screening mammogram will be mailed directly to the patient.

RECOMMENDATION:
Screening mammogram in one year. (Code:[id number]-[id number])

BI-RADS CATEGORY  1: Negative.

## 2023-07-07 ENCOUNTER — Encounter: Payer: Self-pay | Admitting: Podiatry

## 2023-07-09 NOTE — Discharge Instructions (Addendum)
[facility]  POST OPERATIVE INSTRUCTIONS FOR DR. Ether Griffins AND DR. Desirae Mancusi [facility] CLINIC PODIATRY DEPARTMENT   Take your medication as prescribed.  Pain medication should be taken only as needed.  May use Tylenol between pain medication doses if needed.  Would recommend not using NSAIDs during postoperative healing as it can slow healing.  If still having severe pain try taking 1 pain tablet every 4 hours.  If still severe pain try taking 2 pain tablets every 6 hours.  If still severe pain then try taking 2 pain tablets every 4 hours.  Try to take the pain medication as prescribed.  Keep the dressing clean, dry and intact.  Remain nonweightbearing to the surgical extremity at all times.  Keep your foot elevated above the heart level for the first 48 hours.  Continue elevation thereafter to improve swelling.  May apply ice to the top of the foot or back of the knee for maximum 10 minutes out of every 1 hour as needed.  Walking to the bathroom and brief periods of walking are acceptable, unless we have instructed you to be non-weight bearing.  Always wear your post-op shoe when walking.  Always use your crutches if you are to be non-weight bearing.  Do not take a shower. Baths are permissible as long as the foot is kept out of the water.   Every hour you are awake:  Bend your knee 15 times. Massage calf 15 times  Call [facility] [phone]) if any of the following problems occur: You develop a temperature or fever. The bandage becomes saturated with blood. Medication does not stop your pain. Injury of the foot occurs. Any symptoms of infection including redness, odor, or red streaks running from wound. [facility]  POST OPERATIVE INSTRUCTIONS FOR DR. Ether Griffins AND DR. Shown Dissinger [facility] CLINIC PODIATRY DEPARTMENT   Take your medication as prescribed.  Pain medication should be taken only as  needed.  Keep the dressing clean, dry and intact.  Keep your foot elevated above the heart level for the first 48 hours.  Walking to the bathroom and brief periods of walking are acceptable, unless we have instructed you to be non-weight bearing.  Always wear your post-op shoe when walking.  Always use your crutches if you are to be non-weight bearing.  Do not take a shower. Baths are permissible as long as the foot is kept out of the water.   Every hour you are awake:  Bend your knee 15 times. Flex foot 15 times Massage calf 15 times  Call [facility] [phone]) if any of the following problems occur: You develop a temperature or fever. The bandage becomes saturated with blood. Medication does not stop your pain. Injury of the foot occurs. Any symptoms of infection including redness, odor, or red streaks running from wound.    PERIPHERAL NERVE BLOCK PATIENT INFORMATION  Your surgeon has requested a peripheral nerve block for your surgery. This anesthetic technique provides excellent post-operative pain relief for you in a safe and effective manner. It will also help reduce the risk of nausea and vomiting and allow earlier discharge from the hospital.   The block is performed under sedation with ultrasound guidance prior to your procedure. Due to the sedation, your may or may not remember the block experience. The nerve block will begin to take effect anywhere from 5 to 30 minutes after being administered. You will be transported to the operating room from  your surgery after the block is completed.   At the end of surgery, when the anesthesia wears off, you will notice a few things. Your may not be able to move or feel the part of your body targeted by the nerve block. These are normal experiences, and they will disappear as the block wears off.  If you had an interscalene nerve block performed (which is common for shoulder surgery), your voice can be very hoarse and you may  feel that you are not able to take as deep a breath as you did before surgery. Some patients may also notice a droopy eyelid on the affected side. These symptoms will resolve once the block wears off.  Pain control: The nerve block technique used is a single injection that can last anywhere from 1-3 days. The duration of the numbness can vary between individuals. After leaving the hospital, it is important that you begin to take your prescribed pain medication when you start to sense the nerve block wearing off. This will help you avoid unpleasant pain at the time the nerve block wears off, which can sometimes be in the middle of the night. The block will only cover pain in the areas targeted by the nerve block so if you experience surgical pain outside of that area, please take your prescribed pain medication. Management of the "numb area": After a nerve block, you cannot feel pain, pressure, or temperature in the affected area so there is an increased risk for injury. You should take extra care to protect the affected areas until sensation and movement returns. Please take caution to not come in contact with extremely hot or cold items because you will not be able to sense or protect yourself form the extremes of temperature.  You may experience some persistent numbness after the procedure by most neurological deficits resolve over time and the incidence of serious long term neurological complications attributable to peripheral nerve blocks are relatively uncommon.      Information for Discharge Teaching: EXPAREL (bupivacaine liposome injectable suspension)   Pain relief is important to your recovery. The goal is to control your pain so you can move easier and return to your normal activities as soon as possible after your procedure. Your physician may use several types of medicines to manage pain, swelling, and more.  Your surgeon or anesthesiologist gave you EXPAREL(bupivacaine) to help control  your pain after surgery.  EXPAREL is a local anesthetic designed to release slowly over an extended period of time to provide pain relief by numbing the tissue around the surgical site. EXPAREL is designed to release pain medication over time and can control pain for up to 72 hours. Depending on how you respond to EXPAREL, you may require less pain medication during your recovery. EXPAREL can help reduce or eliminate the need for opioids during the first few days after surgery when pain relief is needed the most. EXPAREL is not an opioid and is not addictive. It does not cause sleepiness or sedation.   Important! A teal colored band has been placed on your arm with the date, time and amount of EXPAREL you have received. Please leave this armband in place for the full 96 hours following administration, and then you may remove the band. If you return to the hospital for any reason within 96 hours following the administration of EXPAREL, the armband provides important information that your health care providers to know, and alerts them that you have received this anesthetic.  Possible side effects of EXPAREL: Temporary loss of sensation or ability to move in the area where medication was injected. Nausea, vomiting, constipation Rarely, numbness and tingling in your mouth or lips, lightheadedness, or anxiety may occur. Call your doctor right away if you think you may be experiencing any of these sensations, or if you have other questions regarding possible side effects.  Follow all other discharge instructions given to you by your surgeon or nurse. Eat a healthy diet and drink plenty of water or other fluids.

## 2023-07-16 ENCOUNTER — Other Ambulatory Visit: Payer: Self-pay

## 2023-07-16 ENCOUNTER — Ambulatory Visit: Payer: 59 | Admitting: General Practice

## 2023-07-16 ENCOUNTER — Ambulatory Visit
Admission: RE | Admit: 2023-07-16 | Discharge: 2023-07-16 | Disposition: A | Discharge status: Home / Self Care | Payer: 59 | Attending: Podiatry | Admitting: Podiatry

## 2023-07-16 ENCOUNTER — Encounter: Payer: Self-pay | Admitting: Podiatry

## 2023-07-16 ENCOUNTER — Ambulatory Visit: Payer: Self-pay

## 2023-07-16 ENCOUNTER — Encounter
Admission: RE | Disposition: A | Discharge status: Home / Self Care | Payer: Self-pay | Source: Home / Self Care | Attending: Podiatry

## 2023-07-16 DIAGNOSIS — M2012 Hallux valgus (acquired), left foot: Secondary | ICD-10-CM | POA: Diagnosis not present

## 2023-07-16 DIAGNOSIS — M21622 Bunionette of left foot: Secondary | ICD-10-CM | POA: Insufficient documentation

## 2023-07-16 DIAGNOSIS — M2042 Other hammer toe(s) (acquired), left foot: Secondary | ICD-10-CM | POA: Diagnosis not present

## 2023-07-16 DIAGNOSIS — M205X2 Other deformities of toe(s) (acquired), left foot: Secondary | ICD-10-CM | POA: Diagnosis not present

## 2023-07-16 DIAGNOSIS — M21611 Bunion of right foot: Secondary | ICD-10-CM

## 2023-07-16 HISTORY — PX: CORRECTION OVERLAPPING TOES: SHX6615

## 2023-07-16 HISTORY — PX: WEIL OSTEOTOMY: SHX5044

## 2023-07-16 HISTORY — PX: BUNIONECTOMY: SHX129

## 2023-07-16 SURGERY — BUNIONECTOMY
Anesthesia: General | Site: Toe | Laterality: Left

## 2023-07-16 MED ORDER — DEXMEDETOMIDINE HCL IN NACL 80 MCG/20ML IV SOLN
INTRAVENOUS | Status: AC
  Filled 2023-07-16: qty 20

## 2023-07-16 MED ORDER — SODIUM CHLORIDE 0.9 % IV SOLN
INTRAVENOUS | Status: DC | PRN
Start: 2023-07-16 — End: 2023-07-16

## 2023-07-16 MED ORDER — BUPIVACAINE HCL 0.25 % IJ SOLN
INTRAMUSCULAR | Status: AC
  Filled 2023-07-16: qty 1

## 2023-07-16 MED ORDER — FENTANYL CITRATE (PF) 100 MCG/2ML IJ SOLN
INTRAMUSCULAR | Status: AC
  Filled 2023-07-16: qty 2

## 2023-07-16 MED ORDER — ACETAMINOPHEN 10 MG/ML IV SOLN
INTRAVENOUS | Status: AC
Start: 2023-07-16 — End: ?
  Filled 2023-07-16: qty 100

## 2023-07-16 MED ORDER — FENTANYL CITRATE (PF) 100 MCG/2ML IJ SOLN
INTRAMUSCULAR | Status: DC | PRN
  Administered 2023-07-16 (×2): 25 ug via INTRAVENOUS
  Administered 2023-07-16: 50 ug via INTRAVENOUS
  Administered 2023-07-16: 100 ug via INTRAVENOUS

## 2023-07-16 MED ORDER — BUPIVACAINE LIPOSOME 1.3 % IJ SUSP
INTRAMUSCULAR | Status: AC
Start: 2023-07-16 — End: ?
  Filled 2023-07-16: qty 20

## 2023-07-16 MED ORDER — LIDOCAINE HCL (CARDIAC) PF 100 MG/5ML IV SOSY
PREFILLED_SYRINGE | INTRAVENOUS | Status: DC | PRN
  Administered 2023-07-16: 100 mg via INTRATRACHEAL

## 2023-07-16 MED ORDER — ONDANSETRON HCL 4 MG PO TABS
4.0000 mg | ORAL_TABLET | Freq: Three times a day (TID) | ORAL | 0 refills | Status: DC | PRN
  Filled 2023-07-16: qty 20, 7d supply, fill #0

## 2023-07-16 MED ORDER — CEFAZOLIN SODIUM-DEXTROSE 2-4 GM/100ML-% IV SOLN
2.0000 g | INTRAVENOUS | Status: AC
Start: 2023-07-16 — End: 2023-07-16
  Administered 2023-07-16: 2 g via INTRAVENOUS

## 2023-07-16 MED ORDER — PROPOFOL 10 MG/ML IV BOLUS
INTRAVENOUS | Status: AC
  Filled 2023-07-16: qty 20

## 2023-07-16 MED ORDER — MIDAZOLAM HCL 2 MG/2ML IJ SOLN
INTRAMUSCULAR | Status: AC
  Filled 2023-07-16: qty 2

## 2023-07-16 MED ORDER — ONDANSETRON HCL 4 MG/2ML IJ SOLN
INTRAMUSCULAR | Status: DC | PRN
  Administered 2023-07-16: 4 mg via INTRAVENOUS

## 2023-07-16 MED ORDER — BUPIVACAINE HCL (PF) 0.25 % IJ SOLN
INTRAMUSCULAR | Status: DC | PRN
Start: 2023-07-16 — End: 2023-07-16
  Administered 2023-07-16: 20 mL via PERINEURAL

## 2023-07-16 MED ORDER — LIDOCAINE HCL (PF) 2 % IJ SOLN
INTRAMUSCULAR | Status: AC
  Filled 2023-07-16: qty 5

## 2023-07-16 MED ORDER — 0.9 % SODIUM CHLORIDE (POUR BTL) OPTIME
TOPICAL | Status: DC | PRN
  Administered 2023-07-16: 500 mL

## 2023-07-16 MED ORDER — CEFAZOLIN SODIUM-DEXTROSE 2-3 GM-%(50ML) IV SOLR
INTRAVENOUS | Status: AC
Start: 2023-07-16 — End: ?
  Filled 2023-07-16: qty 50

## 2023-07-16 MED ORDER — ASPIRIN 81 MG PO TBEC
81.0000 mg | DELAYED_RELEASE_TABLET | Freq: Two times a day (BID) | ORAL | 0 refills | Status: AC
  Filled 2023-07-16: qty 84, 42d supply, fill #0

## 2023-07-16 MED ORDER — PROPOFOL 10 MG/ML IV BOLUS
INTRAVENOUS | Status: DC | PRN
  Administered 2023-07-16: 170 mg via INTRAVENOUS

## 2023-07-16 MED ORDER — KETOROLAC TROMETHAMINE 15 MG/ML IJ SOLN
INTRAMUSCULAR | Status: DC | PRN
  Administered 2023-07-16: 15 mg via INTRAVENOUS

## 2023-07-16 MED ORDER — BUPIVACAINE LIPOSOME 1.3 % IJ SUSP
INTRAMUSCULAR | Status: DC | PRN
  Administered 2023-07-16: 20 mL via PERINEURAL

## 2023-07-16 MED ORDER — OXYCODONE-ACETAMINOPHEN 5-325 MG PO TABS
1.0000 | ORAL_TABLET | Freq: Four times a day (QID) | ORAL | 0 refills | Status: AC | PRN
  Filled 2023-07-16: qty 28, 7d supply, fill #0

## 2023-07-16 MED ORDER — DEXAMETHASONE SODIUM PHOSPHATE 10 MG/ML IJ SOLN
INTRAMUSCULAR | Status: AC
Start: 2023-07-16 — End: ?
  Filled 2023-07-16: qty 1

## 2023-07-16 MED ORDER — GLYCOPYRROLATE 0.2 MG/ML IJ SOLN
INTRAMUSCULAR | Status: AC
Start: 2023-07-16 — End: ?
  Filled 2023-07-16: qty 1

## 2023-07-16 MED ORDER — DEXAMETHASONE SODIUM PHOSPHATE 10 MG/ML IJ SOLN
INTRAMUSCULAR | Status: DC | PRN
  Administered 2023-07-16: 10 mg

## 2023-07-16 MED ORDER — DEXMEDETOMIDINE HCL IN NACL 80 MCG/20ML IV SOLN
INTRAVENOUS | Status: DC | PRN
  Administered 2023-07-16 (×2): 8 ug via INTRAVENOUS
  Administered 2023-07-16: 4 ug via INTRAVENOUS

## 2023-07-16 MED ORDER — AMOXICILLIN-POT CLAVULANATE 875-125 MG PO TABS
1.0000 | ORAL_TABLET | Freq: Two times a day (BID) | ORAL | 0 refills | Status: DC
  Filled 2023-07-16: qty 14, 7d supply, fill #0

## 2023-07-16 MED ORDER — EPHEDRINE SULFATE (PRESSORS) 50 MG/ML IJ SOLN
INTRAMUSCULAR | Status: DC | PRN
  Administered 2023-07-16: 10 mg via INTRAVENOUS
  Administered 2023-07-16: 5 mg via INTRAVENOUS

## 2023-07-16 MED ORDER — ACETAMINOPHEN 10 MG/ML IV SOLN
INTRAVENOUS | Status: DC | PRN
  Administered 2023-07-16: 1000 mg via INTRAVENOUS

## 2023-07-16 MED ORDER — MIDAZOLAM HCL 5 MG/5ML IJ SOLN
INTRAMUSCULAR | Status: DC | PRN
  Administered 2023-07-16: 2 mg via INTRAVENOUS

## 2023-07-16 MED ORDER — DEXAMETHASONE SODIUM PHOSPHATE 4 MG/ML IJ SOLN
INTRAMUSCULAR | Status: DC | PRN
  Administered 2023-07-16: 4 mg via INTRAVENOUS

## 2023-07-16 SURGICAL SUPPLY — 63 items
APL SKNCLS STERI-STRIP NONHPOA (GAUZE/BANDAGES/DRESSINGS) ×2
BENZOIN TINCTURE PRP APPL 2/3 (GAUZE/BANDAGES/DRESSINGS) IMPLANT
BIT DRILL 1.7 LNG CANN (DRILL) IMPLANT
BLADE MED AGGRESSIVE (BLADE) IMPLANT
BLADE OSC/SAGITTAL MD 9X18.5 (BLADE) IMPLANT
BLADE SAW LAPIPLASTY 40X11 (BLADE) IMPLANT
BLADE SURG 15 STRL LF DISP TIS (BLADE) IMPLANT
BLADE SURG 15 STRL SS (BLADE) ×6
BNDG CMPR STD VLCR NS LF 5.8X4 (GAUZE/BANDAGES/DRESSINGS) ×2
BNDG CMPR STD VLCR NS LF 5.8X6 (GAUZE/BANDAGES/DRESSINGS) ×2
BNDG ELASTIC 4X5.8 VLCR NS LF (GAUZE/BANDAGES/DRESSINGS) ×2 IMPLANT
BNDG ELASTIC 6X5.8 VLCR NS LF (GAUZE/BANDAGES/DRESSINGS) ×2 IMPLANT
BNDG ESMARCH 4 X 12 STRL LF (GAUZE/BANDAGES/DRESSINGS) ×2
BNDG ESMARCH 4X12 STRL LF (GAUZE/BANDAGES/DRESSINGS) ×2 IMPLANT
BNDG GAUZE DERMACEA FLUFF 4 (GAUZE/BANDAGES/DRESSINGS) ×2 IMPLANT
BNDG GZE DERMACEA 4 6PLY (GAUZE/BANDAGES/DRESSINGS) ×2
CANISTER SUCT 1200ML W/VALVE (MISCELLANEOUS) ×2 IMPLANT
CLIP FIXATION STAPLE 10X10X10 (Staple) IMPLANT
COVER LIGHT HANDLE UNIVERSAL (MISCELLANEOUS) ×4 IMPLANT
CUFF TOURN SGL QUICK 18X4 (TOURNIQUET CUFF) IMPLANT
DRAPE FLUOR MINI C-ARM 54X84 (DRAPES) ×2 IMPLANT
DRSG TEGADERM 4X4.75 (GAUZE/BANDAGES/DRESSINGS) IMPLANT
DURAPREP 26ML APPLICATOR (WOUND CARE) ×2 IMPLANT
ELECT REM PT RETURN 9FT ADLT (ELECTROSURGICAL) ×2
ELECTRODE REM PT RTRN 9FT ADLT (ELECTROSURGICAL) ×2 IMPLANT
GAUZE SPONGE 4X4 12PLY STRL (GAUZE/BANDAGES/DRESSINGS) ×2 IMPLANT
GAUZE XEROFORM 1X8 LF (GAUZE/BANDAGES/DRESSINGS) ×2 IMPLANT
GLOVE BIOGEL PI IND STRL 7.5 (GLOVE) ×2 IMPLANT
GLOVE SURG SS PI 7.0 STRL IVOR (GLOVE) ×2 IMPLANT
GOWN STRL REUS W/ TWL LRG LVL3 (GOWN DISPOSABLE) ×4 IMPLANT
GOWN STRL REUS W/TWL LRG LVL3 (GOWN DISPOSABLE) ×4
K-WIRE DBL END TROCAR 6X.045 (WIRE) ×2
KIT PROCEDURE DRILL (DRILL) IMPLANT
KIT TURNOVER KIT A (KITS) ×2 IMPLANT
KWIRE DBL END TROCAR 6X.045 (WIRE) IMPLANT
LAPIPLASTY SYS 4A (Orthopedic Implant) ×2 IMPLANT
NS IRRIG 500ML POUR BTL (IV SOLUTION) ×2 IMPLANT
PACK EXTREMITY ARMC (MISCELLANEOUS) ×2 IMPLANT
PADDING CAST BLEND 4X4 NS (MISCELLANEOUS) ×6 IMPLANT
SCREW 2.0X11 HEADED (Screw) IMPLANT
SCREW 2.7 HIGH PITCH LOCKING (Screw) IMPLANT
SCREW CANN HEAD ST 2.0X10 (Screw) IMPLANT
SCREW CANN TRANSVERSE 3.5X35 (Screw) IMPLANT
SPLINT CAST 1 STEP 4X30 (MISCELLANEOUS) ×2 IMPLANT
SPONGE T-LAP 18X18 ~~LOC~~+RFID (SPONGE) IMPLANT
STOCKINETTE IMPERVIOUS LG (DRAPES) ×2 IMPLANT
STRIP CLOSURE SKIN 1/4X4 (GAUZE/BANDAGES/DRESSINGS) IMPLANT
SUT ETHILON 4-0 (SUTURE)
SUT ETHILON 4-0 FS2 18XMFL BLK (SUTURE)
SUT MNCRL 4-0 (SUTURE) ×6
SUT MNCRL 4-0 27XMFL (SUTURE) ×6
SUT MNCRL+ 5-0 UNDYED PC-3 (SUTURE) IMPLANT
SUT VIC AB 0 CT1 27 (SUTURE)
SUT VIC AB 0 CT1 27XCR 8 STRN (SUTURE) IMPLANT
SUT VIC AB 2-0 SH 27 (SUTURE)
SUT VIC AB 2-0 SH 27XBRD (SUTURE) IMPLANT
SUT VIC AB 3-0 SH 27 (SUTURE) ×10
SUT VIC AB 3-0 SH 27X BRD (SUTURE) IMPLANT
SUT VIC AB 4-0 FS2 27 (SUTURE) IMPLANT
SUTURE ETHLN 4-0 FS2 18XMF BLK (SUTURE) IMPLANT
SUTURE MNCRL 4-0 27XMF (SUTURE) IMPLANT
SYSTEM LAPIPLASTY 4A (Orthopedic Implant) IMPLANT
WIRE SMOOTH TROCAR .9MMX150MML (WIRE) IMPLANT

## 2023-07-16 NOTE — H&P (Signed)
HISTORY AND PHYSICAL INTERVAL NOTE:  07/16/2023  7:06 AM  Victoria Barrett  has presented today for surgery, with the diagnosis of M20.12 - Hallux valgus, left M20.42 - Hammer toe of left foot M21.6X2 - PLlantar flexed metatarsal, left Q66.6 - Fifth med abd valgus.  The various methods of treatment have been discussed with the patient.  No guarantees were given.  After consideration of risks, benefits and other options for treatment, the patient has consented to surgery.  I have reviewed the patients' chart and labs.    PROCEDURE: ALL LEFT FOOT LAPIDUS BUNIONECTOMY WITH POSSIBLE AKIN OSTEOTOMY 2ND METATARSAL WEIL OSTEOTOMY 2ND TOE FLEXOR TENDON TRANSFER TAILORS BUNIONECTOMY WITH OSTEOTOMY   A history and physical examination was performed in my office.  The patient was reexamined.  There have been no changes to this history and physical examination.  Rosetta Posner, DPM

## 2023-07-16 NOTE — Anesthesia Procedure Notes (Signed)
Procedure Name: LMA Insertion Date/Time: 07/16/2023 7:34 AM  Performed by: Jaye Beagle, CRNAPre-anesthesia Checklist: Patient identified, Emergency Drugs available, Suction available and Patient being monitored Patient Re-evaluated:Patient Re-evaluated prior to induction Oxygen Delivery Method: Circle system utilized Preoxygenation: Pre-oxygenation with 100% oxygen Induction Type: IV induction Ventilation: Mask ventilation without difficulty LMA: LMA inserted LMA Size: 4.0 Number of attempts: 1 Placement Confirmation: positive ETCO2 and breath sounds checked- equal and bilateral Tube secured with: Tape Dental Injury: Teeth and Oropharynx as per pre-operative assessment

## 2023-07-16 NOTE — Anesthesia Procedure Notes (Signed)
Anesthesia Regional Block: Adductor canal block   Pre-Anesthetic Checklist: , timeout performed,  Correct Patient, Correct Site, Correct Laterality,  Correct Procedure, Correct Position, site marked,  Risks and benefits discussed,  Surgical consent,  Pre-op evaluation,  At surgeon's request and post-op pain management  Laterality: Lower and Left  Prep: chloraprep       Needles:  Injection technique: Single-shot  Needle Type: Echogenic Needle     Needle Length: 9cm  Needle Gauge: 21     Additional Needles:   Procedures:,,,, ultrasound used (permanent image in chart),,    Narrative:  Start time: 07/16/2023 10:49 AM End time: 07/16/2023 10:58 AM Injection made incrementally with aspirations every 5 mL.  Performed by: Personally  Anesthesiologist: Marisue Humble, MD  Additional Notes: Patient consented for risk and benefits of nerve block including but not limited to nerve damage, failed block, bleeding and infection.  Patient voiced understanding.  Functioning IV was confirmed and monitors were applied.  Timeout done prior to procedure and prior to any sedation being given to the patient.  Patient confirmed procedure site prior to any sedation given to the patient.  A 50mm 22ga Stimuplex needle was used. Sterile prep,hand hygiene and sterile gloves were used.  Minimal sedation used for procedure.  No paresthesia endorsed by patient during the procedure.  Negative aspiration and negative test dose prior to incremental administration of local anesthetic. The patient tolerated the procedure well with no immediate complications. Block placed under general anesthesia.  Vital signs noted in CRNA record for OR.

## 2023-07-16 NOTE — Transfer of Care (Signed)
Immediate Anesthesia Transfer of Care Note  Patient: Victoria Barrett  Procedure(s) Performed: LAPIDUS + AKIN (Left: Toe) 28308 - WEIL X 2 (Left) 94854 - RECONSTRUCTION OVERLAPPING TOES (Left)  Patient Location: PACU  Anesthesia Type: General  Level of Consciousness: awake, alert  and patient cooperative  Airway and Oxygen Therapy: Patient Spontanous Breathing and Patient connected to supplemental oxygen  Post-op Assessment: Post-op Vital signs reviewed, Patient's Cardiovascular Status Stable, Respiratory Function Stable, Patent Airway and No signs of Nausea or vomiting  Post-op Vital Signs: Reviewed and stable  Complications: No notable events documented.

## 2023-07-16 NOTE — Anesthesia Postprocedure Evaluation (Signed)
Anesthesia Post Note  Patient: Victoria Barrett  Procedure(s) Performed: LAPIDUS + AKIN (Left: Toe) 28308 - WEIL X 2 (Left) 57846 - RECONSTRUCTION OVERLAPPING TOES (Left)  Patient location during evaluation: PACU Anesthesia Type: General Level of consciousness: awake and alert Pain management: pain level controlled Vital Signs Assessment: post-procedure vital signs reviewed and stable Respiratory status: spontaneous breathing, nonlabored ventilation, respiratory function stable and patient connected to nasal cannula oxygen Cardiovascular status: blood pressure returned to baseline and stable Postop Assessment: no apparent nausea or vomiting Anesthetic complications: no Comments: Discharged with exparel bracelet on.   No notable events documented.   Last Vitals:  Vitals:   07/16/23 1115 07/16/23 1130  BP: 123/74 121/69  Pulse: 85 80  Resp: 18 18  Temp:    SpO2: 99% 100%    Last Pain:  Vitals:   07/16/23 1130  TempSrc:   PainSc: 0-No pain                 Adream Parzych C Aaliyah Gavel

## 2023-07-16 NOTE — Anesthesia Procedure Notes (Signed)
Anesthesia Regional Block: Popliteal block   Pre-Anesthetic Checklist: , timeout performed,  Correct Patient, Correct Site, Correct Laterality,  Correct Procedure, Correct Position, site marked,  Risks and benefits discussed,  Surgical consent,  Pre-op evaluation,  At surgeon's request and post-op pain management  Laterality: Left and Lower  Prep: chloraprep       Needles:  Injection technique: Single-shot  Needle Type: Echogenic Needle     Needle Length: 9cm  Needle Gauge: 21     Additional Needles:   Procedures:,,,, ultrasound used (permanent image in chart),,    Narrative:  Start time: 07/16/2023 10:49 AM End time: 07/16/2023 10:58 AM Injection made incrementally with aspirations every 5 mL.  Performed by: Personally  Anesthesiologist: Marisue Humble, MD  Additional Notes: Functioning IV was confirmed and monitors applied. Ultrasound guidance: relevant anatomy identified, needle position confirmed, local anesthetic spread visualized around nerve(s)., vascular puncture avoided.  Image printed for medical record.  Negative aspiration and no paresthesias; incremental administration of local anesthetic. The patient tolerated the procedure well. Vitals signs recorded in CRNA chart, block placed with patient still under general anesthesic. Marland Kitchen

## 2023-07-16 NOTE — Op Note (Signed)
PODIATRY / FOOT AND ANKLE SURGERY OPERATIVE REPORT    SURGEON: Rosetta Posner, DPM  PRE-OPERATIVE DIAGNOSIS: All left foot 1.  Hallux valgus contracture 2.  Second toe 3.  Long plantarflexed second metatarsal 4.  Tailor's bunion  POST-OPERATIVE DIAGNOSIS: Same  PROCEDURE(S): All left foot Lapidus bunionectomy with Akin osteotomy Second metatarsal Weil osteotomy Flexor tendon transfer second toe Tailor's bunionectomy with osteotomy  HEMOSTASIS: Left ankle tourniquet  ANESTHESIA: general  ESTIMATED BLOOD LOSS: 30 cc  PATHOLOGY/SPECIMEN(S): None  INDICATIONS:   LORAINA STAUFFER is a 62 y.o. female who presents with pain to the left forefoot including the first metatarsal phalange joint, second metatarsal phalange joint and fifth metatarsal phalange joints.  Patient has associated hallux valgus contracture with tailor's bunion and second hammertoe contracture with long plantarflexed second metatarsal.  Patient has.  Treated conservatively.  She subsequently had surgery on the right side several years ago and did well with this so she presents today after conservative measures of failed to relieve pain discomfort for further surgical intervention to the the opposite limb.  All treatment options were discussed with the patient both conservative and surgical attempts at correction include potential risks and complications at this time patient is elected for surgical intervention.  No guarantees given.  Consent obtained prior to procedure.  DESCRIPTION: After obtaining full informed written consent, the patient was brought back to the operating room and placed supine upon the operating table.  The patient received IV antibiotics prior to induction.  After obtaining adequate anesthesia, the patient was prepped and draped in the standard fashion.  An Esmarch bandage was used to exsanguinate the left lower extremity and the pneumatic ankle tourniquet was inflated.  Attention was directed to the  first tarsometatarsal joint where a linear longitudinal incision was made over the area as well as the dorsal medial aspect first metatarsal phalange joint.  Both incisions were made medial to the tendon of the extensor hallucis longus involve the contour deformity.  The incisions were deepened through the subcutaneous tissues utilizing sharp and blunt dissection and care was taken to identify and retract all vital neural and vascular structures and all venous contributories were cauterized as necessary.  Deep dissection was then continued down to the periosteal and capsular structures to the first metatarsal phalange joint and first tarsometatarsal joint.  Periosteal incisions were then made medial to the tendon of the extensor hallucis longus at both incision sites.  The periosteal and capsular structures were reflected medially and laterally thereby exposing the first tarsometatarsal joint and first metatarsal phalangeal joint at the operative sites.  At the first metatarsal phalange joint level the extensor tendon was retracted medially and the skin and subcutaneous tissue was retracted laterally.  At this time the lateral release was performed releasing the lateral collateral ligaments as well as suspensory ligaments's and the conjoined tendon of the abductor hallucis completing the lateral release.  This appeared to mobilize the first metatarsal phalange joint further.  Attention was then directed back to the first tarsometatarsal joint.  A sagittal bone saw was then ran through the joint to loosen any capsular ligamentous structures and any large bone fragments.  This appeared to mobilize the first metatarsal joint fairly fluidly.  At this time the wire for the rotational correction was placed into the first metatarsal base.  The fulcrum 3 and 1 cut guide was then placed with the appropriate orientation.  A small incision was then made over the dorsal aspect of the second metatarsal midshaft.  Blunt  dissection was continued down to the midshaft.  At this time the reduction aid was applied to the cup was sitting on the first metatarsal midshaft and the middle portion was on the second metatarsal laterally.  The rotational wire was then used to rotate the first metatarsal and the corrected position.  At this time the joint alignment aid was then compressed reducing the first intermetatarsal space angle as well as the rotation of the first metatarsal.  This was checked under fluoroscopic guidance and appeared to be in good position overall reducing the intermetatarsal angle as well as sesamoid rotation angle and hallux abductus angle.  The alignment wire to hold fixation was then applied.  The cut guide was examined under fluoroscopic guidance and appeared to be in the appropriate orientation so wires and then placed in the cut guide for stabilization purposes.  At this time the sagittal saw was then used to resect the base of the first metatarsal at the articular cartilage level and the distal articular cartilage of the medial cuneiform with the appropriate orientation.  After this was performed the cut guide was removed along with one of the pins and the 2 dorsal pins were left intact.  The joint reduction aid was then removed as well the alignment wire holding stabilization.  The rotational pin was also removed.  The joint distractor was then applied distracted the joint open and all of the articular cartilage of the abdomen resected with the bone fragments were removed and passed off the operative site.  This still appeared to be a little bit of cartilage present to the medial cuneiform medially so this was removed with a curette and osteotome.  The surgical site was flushed with copious amounts normal sterile saline.  No articular cartilage remained.  Joint fenestration was then performed with a 2.0 drill bit.  The fulcrum was then applied once again and the first tarsometatarsal joint fusion site was  compressed with the compressor.  Alignment was then checked under fluoroscopic guidance and appeared to be excellent.  There appeared to be a medial overhang which was resected and passed off the operative site.  The threaded olive wire was then placed for temporary compression across the first tarsometatarsal joint.  At this time the medial plate was then bent and put into place with temporary olive wire fixation and checked under fluoroscopic guidance.  It appeared to sit in the appropriate orientation.  At this time the most proximal and most distal screws were then placed that were 2.7 mm locking from [employer].  The olive wires were then removed and then the 2 innermost 2.7 millimeter screws were placed, locking from [employer].  The joint compressor and pins were removed but the temporary threaded olive wire was left intact.  The dorsal plate was then applied.  In a similar construct the 4 screws were placed that were 2.7 mm locking screws.  The plates appeared to sit excellently and had good contour overall with no prominence.  The joint site appeared to be stable and well compressed.  Under fluoroscopic guidance stressing was then performed and there did appear to be some gapping at the intercuneiform joint between the first and second cuneiforms.  At this time it was determined to place a intercuneiform screw.  The guidewire was then placed through the medial cuneiform into the second cuneiform with the appropriate orientation between the 2 plates.  This was checked under fluoroscopic guidance and appeared to be in good position  overall.  At this time a 4.0 x 24mm partially-threaded headed screw from [facility] medical was placed.  Excellent compression was noted across the intercuneiform joint area.  The area was stressed further under fluoroscopic guidance and appeared to be very stable.  Overall the hardware appeared to be of the appropriate size and length.  Attention was directed to the first  metatarsal phalangeal joint area where the medial eminence was resected and passed off the operative site.  There still appeared to be some increase in the hallux interphalangeus angle so dissection was continued further at the first metatarsal phalangel joint level to the midshaft of the proximal phalanx of the hallux.  The extensor tendon was retracted medially and a capsular incision was made and periosteal incision was made into this area and the structures were retracted medially and laterally.  A temporary wire was placed as a cut guide at the lateral midshaft portion of the proximal phalanx.  At this time sagittal bone saw was then used to resect a small triangular wedge out of the area removing about a 4 mm bone wedge.  After this was removed the temporary K wire was removed.  The osteotomy was then closed rather easily.  At this time utilizing standard AO principles and techniques a 10 mm [employer] staple was then placed across the osteotomy site with excellent compression.  The surgical sites were flushed with copious amounts normal sterile saline.  The toe was then held in a rectus position and a small portion of the capsule was resected medially and a capsulotomy due to the excessive nature of the capsule.  The capsular and periosteal tissues at both the first tarsometatarsal joint and first metatarsal phalangeal joint were then reapproximated well coapted with 3-0 Vicryl.  The subcutaneous tissue was reapproximated well coapted with 3-0 Vicryl and 4-0 Monocryl.  Subcuticular stitching was then performed with 4-0 Monocryl in a running interconnected stitch at both incision sites.  Attention was then directed to the second metatarsal phalangeal joint and second toe where an incision was made over the area extending slightly laterally to the area just proximal to the DIPJ.  The incision was deepened to the subcutaneous tissues utilizing sharp and blunt dissection and care was taken to identify and  retract all vital neurovascular structures and all venous contributories were cauterized as necessary.  At this time the extensor digitorum longus tendon was retracted laterally.  A periosteal and capsular incision was made over the dorsal aspect of the second metatarsal phalangeal joint.  The capsular and periosteal tissues were reflected medially and laterally thereby exposing the joint surface.  At this time a sagittal bone saw was used to create an osteotomy through the second metatarsal, Weil osteotomy.  The capital fragment was shifted proximally approximately 5 mm.  This was then held in place with temporary fixation for 2.0 cannulated screws from [employer].  This was checked under fluoroscopic guidance and appeared to have the appropriate orientation and good correction.  At this time utilizing standard AO principles and techniques a 2.0 x 11 mm and 2.0 x 10 mm [employer] headed partially-threaded screws were placed across the osteotomy site with the appropriate orientation.  Excellent compression was noted.  Appeared to be very stable.  The guidewires were removed and passed off the operative site.  Dissection was then continued distally at the distal lateral aspect of the second toe at the PIPJ and DIPJ level.  Blunt dissection was continued with a hemostat to the flexor  tendon sheath.  The flexor tendon sheath was then opened and the flexor digitorum longus tendon was identified.  The flexor digitorum longus tendon was transected as distally as possible.  The tendon was then brought to the dorsal aspect of the foot from plantar.  A small blunt instrument was used to go underneath the extensor tendon.  Hemostat was then used to collect the flexor tendon and passed it dorsally and medially underneath the extensor tendon.  The toe was then held in a rectus to slightly plantarflexed position.  At this time 3-0 Vicryl was then used to tenodesed the flexor to the extensor tendon with a over and over stitch x  3.  The toe appeared to sit excellently in a slightly plantarflexed position as expected.  There appeared to be good stability across the second metatarsal phalangeal joint.  The pneumatic tourniquet was deflated and a prompt hyperemic response was noted all digits of the left foot.  The surgical site was flushed with copious amounts normal sterile saline.  The capsular and periosteal tissues at the second metatarsal phalange joint reapproximated well coapted with 3-0 Vicryl.  The subcutaneous tissue was reapproximated well coapted with combination of 3-0 Vicryl and 4-0 Monocryl.  The subcuticular skin closure was then obtained with 4-0 Monocryl in a running stitch.  The pneumatic ankle tourniquet was inflated after Esmarch bandage was used to exsanguinate the left lower extremity.  Attention was then directed to the dorsal lateral aspect of the fifth metatarsal phalangeal joint.  A linear longitudinal incision was made over this area lateral to the tendon of extensor digitorum longus involve the contour of the deformity.  An incision was then made into the capsular and periosteal tissues reflecting the medially and laterally thereby exposing the fifth metatarsal phalange joint at the operative site.  At this time sagittal bone saw was then used to resect the lateral eminence..  A long Weil osteotomy was then performed through the fifth metatarsal.  After the osteotomy was performed the capital fragment was shifted medially and rotated medially as well to reduce the intermetatarsal angle as well as lateral deviation angle.  While holding this in the correct position 2 wires were placed across the dorsal aspect of the fifth metatarsal shaft into the capital fragment with the appropriate orientation.  Fluoroscopic imaging was used to verify correction as well as placement of wires.  Appeared to be excellent overall.  Utilizing standard AO principles and technique one 2.0 x 10 mm and another 2.0 x 11 mm  partially-threaded headed screws from [employer] were placed across the osteotomy site with excellent compression.  The guidewires were then removed and passed off the operative site.  The lateral overhang was then resected with a sagittal saw and rongeur.  The surgical sites were flushed with copious amounts normal sterile saline.  C-arm imaging was utilized to verify correction at all areas of the procedure site which appeared to be excellent.  The hardware all appeared to be the appropriate length and size.  Good correction was noted to all areas of the surgical intervention of the forefoot.  The periosteal and capsular tissue was reapproximated well coapted with 3-0 Vicryl along with the subcutaneous tissue and 4 Monocryl was also used during subcuticular and subcutaneous closures well.  Benzoin Steri-Strips were applied to the incision sites.  Pneumatic ankle tourniquet was deflated and a prompt hyperemic response was noted all digits left foot.  Postoperative dressing is applied consisting of Xeroform to all incision sites  followed by 4 x 4 gauze, gauze roll, Webril, posterior splint, Ace wrap.  The patient tolerated the procedure and anesthesia well and was transferred to the recovery in vital signs stable vascular status intact all toes left foot.  Following.  Postoperative monitoring the patient be discharged home with the appropriate orders, instructions, and medications.  Patient is to remain nonweightbearing at all times left lower extremity and follow-up in clinic 1 week after surgical date.  COMPLICATIONS: None  CONDITION: Good, stable  Rosetta Posner, DPM

## 2023-07-16 NOTE — Anesthesia Preprocedure Evaluation (Addendum)
Anesthesia Evaluation  Patient identified by MRN, date of birth, ID band Patient awake    Reviewed: Allergy & Precautions, H&P , NPO status , Patient's Chart, lab work & pertinent test results  Airway Mallampati: II  TM Distance: >3 FB Neck ROM: Full    Dental no notable dental hx.    Pulmonary neg pulmonary ROS   Pulmonary exam normal breath sounds clear to auscultation       Cardiovascular negative cardio ROS Normal cardiovascular exam Rhythm:Regular Rate:Normal     Neuro/Psych negative neurological ROS  negative psych ROS   GI/Hepatic negative GI ROS, Neg liver ROS,,,  Endo/Other  negative endocrine ROS    Renal/GU negative Renal ROS  negative genitourinary   Musculoskeletal negative musculoskeletal ROS (+)    Abdominal   Peds negative pediatric ROS (+)  Hematology negative hematology ROS (+)   Anesthesia Other Findings   Reproductive/Obstetrics negative OB ROS                             Anesthesia Physical Anesthesia Plan  ASA: 1  Anesthesia Plan: General   Post-op Pain Management:    Induction: Intravenous  PONV Risk Score and Plan:   Airway Management Planned: LMA  Additional Equipment:   Intra-op Plan:   Post-operative Plan: Extubation in OR  Informed Consent: I have reviewed the patients History and Physical, chart, labs and discussed the procedure including the risks, benefits and alternatives for the proposed anesthesia with the patient or authorized representative who has indicated his/her understanding and acceptance.     Dental Advisory Given  Plan Discussed with: Anesthesiologist, CRNA and Surgeon  Anesthesia Plan Comments: (Patient consented for risks of anesthesia including but not limited to:  - adverse reactions to medications - damage to eyes, teeth, lips or other oral mucosa - nerve damage due to positioning  - sore throat or hoarseness -  Damage to heart, brain, nerves, lungs, other parts of body or loss of life  Patient voiced understanding and assent.)        Anesthesia Quick Evaluation

## 2023-07-20 ENCOUNTER — Encounter: Payer: Self-pay | Admitting: Podiatry

## 2023-07-22 DIAGNOSIS — M2042 Other hammer toe(s) (acquired), left foot: Secondary | ICD-10-CM | POA: Diagnosis not present

## 2023-07-22 DIAGNOSIS — M2012 Hallux valgus (acquired), left foot: Secondary | ICD-10-CM | POA: Diagnosis not present

## 2023-08-28 DIAGNOSIS — M2042 Other hammer toe(s) (acquired), left foot: Secondary | ICD-10-CM | POA: Diagnosis not present

## 2023-08-28 DIAGNOSIS — M2012 Hallux valgus (acquired), left foot: Secondary | ICD-10-CM | POA: Diagnosis not present

## 2023-08-28 DIAGNOSIS — M21622 Bunionette of left foot: Secondary | ICD-10-CM | POA: Diagnosis not present

## 2023-09-28 DIAGNOSIS — M2042 Other hammer toe(s) (acquired), left foot: Secondary | ICD-10-CM | POA: Diagnosis not present

## 2023-09-28 DIAGNOSIS — M21622 Bunionette of left foot: Secondary | ICD-10-CM | POA: Diagnosis not present

## 2023-09-28 DIAGNOSIS — M2012 Hallux valgus (acquired), left foot: Secondary | ICD-10-CM | POA: Diagnosis not present

## 2023-09-28 DIAGNOSIS — M216X2 Other acquired deformities of left foot: Secondary | ICD-10-CM | POA: Diagnosis not present

## 2023-10-26 ENCOUNTER — Other Ambulatory Visit: Payer: Self-pay | Admitting: Internal Medicine

## 2023-10-26 DIAGNOSIS — Z1231 Encounter for screening mammogram for malignant neoplasm of breast: Secondary | ICD-10-CM

## 2023-11-27 DIAGNOSIS — M2012 Hallux valgus (acquired), left foot: Secondary | ICD-10-CM | POA: Diagnosis not present

## 2023-11-27 DIAGNOSIS — Z09 Encounter for follow-up examination after completed treatment for conditions other than malignant neoplasm: Secondary | ICD-10-CM | POA: Diagnosis not present

## 2023-11-27 DIAGNOSIS — M21622 Bunionette of left foot: Secondary | ICD-10-CM | POA: Diagnosis not present

## 2023-11-27 DIAGNOSIS — M2042 Other hammer toe(s) (acquired), left foot: Secondary | ICD-10-CM | POA: Diagnosis not present

## 2023-12-23 ENCOUNTER — Ambulatory Visit
Admission: RE | Admit: 2023-12-23 | Discharge: 2023-12-23 | Disposition: A | Discharge status: Home / Self Care | Payer: 59 | Source: Ambulatory Visit | Attending: Internal Medicine | Admitting: Internal Medicine

## 2023-12-23 ENCOUNTER — Other Ambulatory Visit: Payer: Self-pay

## 2023-12-23 ENCOUNTER — Encounter: Payer: 59 | Admitting: Internal Medicine

## 2023-12-23 ENCOUNTER — Encounter: Payer: Self-pay | Admitting: Internal Medicine

## 2023-12-23 ENCOUNTER — Ambulatory Visit (INDEPENDENT_AMBULATORY_CARE_PROVIDER_SITE_OTHER): Payer: 59 | Admitting: Internal Medicine

## 2023-12-23 VITALS — BP 110/74 | HR 87 | Ht 66.0 in | Wt 176.0 lb

## 2023-12-23 DIAGNOSIS — E663 Overweight: Secondary | ICD-10-CM

## 2023-12-23 DIAGNOSIS — Z0000 Encounter for general adult medical examination without abnormal findings: Secondary | ICD-10-CM

## 2023-12-23 DIAGNOSIS — H6091 Unspecified otitis externa, right ear: Secondary | ICD-10-CM | POA: Insufficient documentation

## 2023-12-23 DIAGNOSIS — M21612 Bunion of left foot: Secondary | ICD-10-CM

## 2023-12-23 DIAGNOSIS — M21611 Bunion of right foot: Secondary | ICD-10-CM

## 2023-12-23 DIAGNOSIS — H60311 Diffuse otitis externa, right ear: Secondary | ICD-10-CM | POA: Diagnosis not present

## 2023-12-23 DIAGNOSIS — R7301 Impaired fasting glucose: Secondary | ICD-10-CM | POA: Diagnosis not present

## 2023-12-23 DIAGNOSIS — E785 Hyperlipidemia, unspecified: Secondary | ICD-10-CM | POA: Diagnosis not present

## 2023-12-23 DIAGNOSIS — R5383 Other fatigue: Secondary | ICD-10-CM | POA: Diagnosis not present

## 2023-12-23 DIAGNOSIS — Z1231 Encounter for screening mammogram for malignant neoplasm of breast: Secondary | ICD-10-CM | POA: Insufficient documentation

## 2023-12-23 LAB — COMPREHENSIVE METABOLIC PANEL WITH GFR
ALT: 19 U/L (ref 0–35)
AST: 19 U/L (ref 0–37)
Albumin: 4.2 g/dL (ref 3.5–5.2)
Alkaline Phosphatase: 51 U/L (ref 39–117)
BUN: 17 mg/dL (ref 6–23)
CO2: 30 meq/L (ref 19–32)
Calcium: 9.3 mg/dL (ref 8.4–10.5)
Chloride: 103 meq/L (ref 96–112)
Creatinine, Ser: 0.65 mg/dL (ref 0.40–1.20)
GFR: 94.57 mL/min (ref >60.00)
Glucose, Bld: 82 mg/dL (ref 70–99)
Potassium: 4 meq/L (ref 3.5–5.1)
Sodium: 139 meq/L (ref 135–145)
Total Bilirubin: 0.5 mg/dL (ref 0.2–1.2)
Total Protein: 6.7 g/dL (ref 6.0–8.3)

## 2023-12-23 LAB — CBC WITH DIFFERENTIAL/PLATELET
Basophils Absolute: 0.1 10*3/uL (ref 0.0–0.1)
Basophils Relative: 1.6 % (ref 0.0–3.0)
Eosinophils Absolute: 0.3 10*3/uL (ref 0.0–0.7)
Eosinophils Relative: 4.4 % (ref 0.0–5.0)
HCT: 39.6 % (ref 36.0–46.0)
Hemoglobin: 13.3 g/dL (ref 12.0–15.0)
Lymphocytes Relative: 30.5 % (ref 12.0–46.0)
Lymphs Abs: 1.9 10*3/uL (ref 0.7–4.0)
MCHC: 33.7 g/dL (ref 30.0–36.0)
MCV: 92.3 fl (ref 78.0–100.0)
Monocytes Absolute: 0.7 10*3/uL (ref 0.1–1.0)
Monocytes Relative: 12 % (ref 3.0–12.0)
Neutro Abs: 3.1 10*3/uL (ref 1.4–7.7)
Neutrophils Relative %: 51.5 % (ref 43.0–77.0)
Platelets: 232 10*3/uL (ref 150.0–400.0)
RBC: 4.29 Mil/uL (ref 3.87–5.11)
RDW: 14.8 % (ref 11.5–15.5)
WBC: 6.1 10*3/uL (ref 4.0–10.5)

## 2023-12-23 LAB — LIPID PANEL
Cholesterol: 185 mg/dL (ref 0–200)
HDL: 47.4 mg/dL (ref >39.00)
LDL Cholesterol: 102 mg/dL — ABNORMAL HIGH (ref 0–99)
NonHDL: 137.73
Total CHOL/HDL Ratio: 4
Triglycerides: 179 mg/dL — ABNORMAL HIGH (ref 0.0–149.0)
VLDL: 35.8 mg/dL (ref 0.0–40.0)

## 2023-12-23 LAB — LDL CHOLESTEROL, DIRECT: Direct LDL: 122 mg/dL

## 2023-12-23 LAB — HEMOGLOBIN A1C: Hgb A1c MFr Bld: 5.7 % (ref 4.6–6.5)

## 2023-12-23 MED ORDER — AMOXICILLIN-POT CLAVULANATE 875-125 MG PO TABS
1.0000 | ORAL_TABLET | Freq: Two times a day (BID) | ORAL | 0 refills | Status: DC
  Filled 2023-12-23: qty 14, 7d supply, fill #0

## 2023-12-23 MED ORDER — PREDNISONE 10 MG PO TABS
ORAL_TABLET | ORAL | 0 refills | Status: AC
  Filled 2023-12-23: qty 21, 6d supply, fill #0

## 2023-12-23 MED ORDER — FLUTICASONE PROPIONATE 50 MCG/ACT NA SUSP
2.0000 | Freq: Every day | NASAL | 6 refills | Status: AC
  Filled 2023-12-23: qty 16, 30d supply, fill #0

## 2023-12-23 NOTE — Assessment & Plan Note (Signed)

## 2023-12-23 NOTE — Progress Notes (Addendum)
 Patient ID: Victoria Barrett, female    DOB: 1961-08-19  Age: 63 y.o. MRN: [id number]  The patient is here for annual preventive examination and management of other chronic and acute problems.   The risk factors are reflected in the social history.   The roster of all physicians providing medical care to patient - is listed in the Snapshot section of the chart.   Activities of daily living:  The patient is 100% independent in all ADLs: dressing, toileting, feeding as well as independent mobility   Home safety : The patient has smoke detectors in the home. They wear seatbelts.  There are no unsecured firearms at home. There is no violence in the home.    There is no risks for hepatitis, STDs or HIV. There is no   history of blood transfusion. They have no travel history to infectious disease endemic areas of the world.   The patient has seen their dentist in the last six month. They have seen their eye doctor in the last year. The patinet  denies slight hearing difficulty with regard to whispered voices and some television programs.  They have deferred audiologic testing in the last year.  They do not  have excessive sun exposure. Discussed the need for sun protection: hats, long sleeves and use of sunscreen if there is significant sun exposure.    Diet: the importance of a healthy diet is discussed. They do have a healthy diet.   The benefits of regular aerobic exercise were discussed. The patient  exercises  3 to 5 days per week  for  60 minutes.    Depression screen: there are no signs or vegative symptoms of depression- irritability, change in appetite, anhedonia, sadness/tearfullness.   The following portions of the patient's history were reviewed and updated as appropriate: allergies, current medications, past family history, past medical history,  past surgical history, past social history  and problem list.   Visual acuity was not assessed per patient preference since the patient has  regular follow up with an  ophthalmologist. Hearing and body mass index were assessed and reviewed.    During the course of the visit the patient was educated and counseled about appropriate screening and preventive services including : fall prevention , diabetes screening, nutrition counseling, colorectal cancer screening, and recommended immunizations.    Chief Complaint:  1) recovering well s/p left foot bunionectomy with screws etc   Nov 7  by [facility] podiatrist [facility].  Has been weight bearing for weeks.    2) right ear pain .  Started feeling "stopped up"  after several days of sinus congestion /sneezing .  Has been using Debrox with no results  now with pain       Review of Symptoms  Patient denies headache, fevers, malaise, unintentional weight loss, skin rash, eye pain, sinus congestion and sinus pain, sore throat, dysphagia,  hemoptysis , cough, dyspnea, wheezing, chest pain, palpitations, orthopnea, edema, abdominal pain, nausea, melena, diarrhea, constipation, flank pain, dysuria, hematuria, urinary  Frequency, nocturia, numbness, tingling, seizures,  Focal weakness, Loss of consciousness,  Tremor, insomnia, depression, anxiety, and suicidal ideation.    Physical Exam:  BP 110/74   Pulse 87   Ht 5\' 6"  (1.676 m)   Wt 176 lb (79.8 kg)   LMP 05/27/2017   SpO2 98%   BMI 28.41 kg/m    Physical Exam Vitals reviewed.  Constitutional:      General: She is not in acute distress.  Appearance: Normal appearance. She is well-developed and normal weight. She is not ill-appearing, toxic-appearing or diaphoretic.  HENT:     Head: Normocephalic.     Right Ear: Tympanic membrane and ear canal normal. Swelling and tenderness present. There is no impacted cerumen.     Left Ear: Tympanic membrane, ear canal and external ear normal. There is no impacted cerumen.     Nose: Nose normal.     Mouth/Throat:     Mouth: Mucous membranes are moist.     Pharynx: Oropharynx is clear.   Eyes:     General: No scleral icterus.       Right eye: No discharge.        Left eye: No discharge.     Conjunctiva/sclera: Conjunctivae normal.     Pupils: Pupils are equal, round, and reactive to light.  Neck:     Thyroid: No thyromegaly.     Vascular: No carotid bruit or JVD.  Cardiovascular:     Rate and Rhythm: Normal rate and regular rhythm.     Heart sounds: Normal heart sounds.  Pulmonary:     Effort: Pulmonary effort is normal. No respiratory distress.     Breath sounds: Normal breath sounds.  Chest:  Breasts:    Breasts are symmetrical.     Right: Normal. No swelling, inverted nipple, mass, nipple discharge, skin change or tenderness.     Left: Normal. No swelling, inverted nipple, mass, nipple discharge, skin change or tenderness.  Abdominal:     General: Bowel sounds are normal.     Palpations: Abdomen is soft. There is no mass.     Tenderness: There is no abdominal tenderness. There is no guarding or rebound.  Musculoskeletal:        General: Normal range of motion.     Cervical back: Normal range of motion and neck supple.  Lymphadenopathy:     Cervical: No cervical adenopathy.     Upper Body:     Right upper body: No supraclavicular, axillary or pectoral adenopathy.     Left upper body: No supraclavicular, axillary or pectoral adenopathy.  Skin:    General: Skin is warm and dry.  Neurological:     General: No focal deficit present.     Mental Status: She is alert and oriented to person, place, and time. Mental status is at baseline.  Psychiatric:        Mood and Affect: Mood normal.        Behavior: Behavior normal.        Thought Content: Thought content normal.        Judgment: Judgment normal.    Assessment and Plan: Visit for preventive health examination Assessment & Plan: age appropriate education and counseling updated, referrals for preventative services and immunizations addressed, dietary and smoking counseling addressed, most recent labs  reviewed.  I have personally reviewed and have noted:   1) the patient's medical and social history 2) The pt's use of alcohol, tobacco, and illicit drugs 3) The patient's current medications and supplements 4) Functional ability including ADL's, fall risk, home safety risk, hearing and visual impairment 5) Diet and physical activities 6) Evidence for depression or mood disorder 7) The patient's height, weight, and BMI have been recorded in the chart   I have made referrals, and provided counseling and education based on review of the above    Overweight (BMI 25.0-29.9)  Impaired fasting glucose -     Comprehensive metabolic panel with GFR -  Hemoglobin A1c  Hyperlipidemia, unspecified hyperlipidemia type -     Lipid panel -     LDL cholesterol, direct  Other fatigue -     CBC with Differential/Platelet -     TSH; Future  Bilateral bunions Assessment & Plan: S/p left bunionectomy and pinning in Nov 2024.   With excellent results    Acute diffuse otitis externa of right ear Assessment & Plan: Augmentin and prednisone prescribed    Other orders -     Amoxicillin-Pot Clavulanate; Take 1 tablet by mouth 2 (two) times daily.  Dispense: 14 tablet; Refill: 0 -     predniSONE; Take 6 tablets (60 mg total) by mouth daily for 1 day, THEN 5 tablets (50 mg total) daily for 1 day, THEN 4 tablets (40 mg total) daily for 1 day, THEN 3 tablets (30 mg total) daily for 1 day, THEN 2 tablets (20 mg total) daily for 1 day, THEN 1 tablet (10 mg total) daily for 1 day.  Dispense: 21 tablet; Refill: 0 -     Fluticasone Propionate; Place 2 sprays into both nostrils daily.  Dispense: 16 g; Refill: 6    Return in about 1 year (around 12/22/2024).  Thersia Flax, MD

## 2023-12-23 NOTE — Assessment & Plan Note (Signed)
 Augmentin and prednisone prescribed

## 2023-12-23 NOTE — Assessment & Plan Note (Signed)
 S/p left bunionectomy and pinning in Nov 2024.   With excellent results

## 2023-12-23 NOTE — Patient Instructions (Addendum)
 I'm treating you for otitis caused by chronic congestion.   Continue daily claritin once or twice daily,  definitely at night  Start flonase use it daily for the rest of Spring .  Afrin twice daily for 3 days oxymetazolone   Augmentin and prednisone for the next week  Daily use of a probiotic advised for 3 weeks.    Flush sinuses  after outside activity  with salt water

## 2023-12-24 ENCOUNTER — Encounter: Payer: Self-pay | Admitting: Internal Medicine

## 2023-12-24 ENCOUNTER — Other Ambulatory Visit: Payer: Self-pay | Admitting: Internal Medicine

## 2023-12-24 ENCOUNTER — Other Ambulatory Visit: Payer: Self-pay

## 2023-12-28 ENCOUNTER — Other Ambulatory Visit: Payer: Self-pay

## 2023-12-28 ENCOUNTER — Ambulatory Visit (INDEPENDENT_AMBULATORY_CARE_PROVIDER_SITE_OTHER)

## 2023-12-28 DIAGNOSIS — D225 Melanocytic nevi of trunk: Secondary | ICD-10-CM | POA: Diagnosis not present

## 2023-12-28 DIAGNOSIS — D2272 Melanocytic nevi of left lower limb, including hip: Secondary | ICD-10-CM | POA: Diagnosis not present

## 2023-12-28 DIAGNOSIS — D2271 Melanocytic nevi of right lower limb, including hip: Secondary | ICD-10-CM | POA: Diagnosis not present

## 2023-12-28 DIAGNOSIS — B0089 Other herpesviral infection: Secondary | ICD-10-CM | POA: Diagnosis not present

## 2023-12-28 DIAGNOSIS — R5383 Other fatigue: Secondary | ICD-10-CM | POA: Diagnosis not present

## 2023-12-28 DIAGNOSIS — D2262 Melanocytic nevi of left upper limb, including shoulder: Secondary | ICD-10-CM | POA: Diagnosis not present

## 2023-12-28 DIAGNOSIS — L821 Other seborrheic keratosis: Secondary | ICD-10-CM | POA: Diagnosis not present

## 2023-12-28 DIAGNOSIS — D2261 Melanocytic nevi of right upper limb, including shoulder: Secondary | ICD-10-CM | POA: Diagnosis not present

## 2023-12-28 DIAGNOSIS — Z86006 Personal history of melanoma in-situ: Secondary | ICD-10-CM | POA: Diagnosis not present

## 2023-12-28 DIAGNOSIS — Z85820 Personal history of malignant melanoma of skin: Secondary | ICD-10-CM | POA: Diagnosis not present

## 2023-12-28 DIAGNOSIS — Z08 Encounter for follow-up examination after completed treatment for malignant neoplasm: Secondary | ICD-10-CM | POA: Diagnosis not present

## 2023-12-28 LAB — TSH: TSH: 1.52 u[IU]/mL (ref 0.35–5.50)

## 2023-12-28 MED ORDER — VALACYCLOVIR HCL 1 G PO TABS
1.0000 g | ORAL_TABLET | Freq: Every day | ORAL | 1 refills | Status: AC | PRN
  Filled 2023-12-28: qty 90, 90d supply, fill #0
  Filled 2024-05-09: qty 90, 90d supply, fill #1

## 2023-12-29 ENCOUNTER — Encounter: Payer: Self-pay | Admitting: Internal Medicine

## 2024-07-11 DIAGNOSIS — D225 Melanocytic nevi of trunk: Secondary | ICD-10-CM | POA: Diagnosis not present

## 2024-07-11 DIAGNOSIS — D2272 Melanocytic nevi of left lower limb, including hip: Secondary | ICD-10-CM | POA: Diagnosis not present

## 2024-07-11 DIAGNOSIS — D2262 Melanocytic nevi of left upper limb, including shoulder: Secondary | ICD-10-CM | POA: Diagnosis not present

## 2024-07-11 DIAGNOSIS — D485 Neoplasm of uncertain behavior of skin: Secondary | ICD-10-CM | POA: Diagnosis not present

## 2024-07-11 DIAGNOSIS — Z86006 Personal history of melanoma in-situ: Secondary | ICD-10-CM | POA: Diagnosis not present

## 2024-07-11 DIAGNOSIS — B079 Viral wart, unspecified: Secondary | ICD-10-CM | POA: Diagnosis not present

## 2024-07-11 DIAGNOSIS — D2261 Melanocytic nevi of right upper limb, including shoulder: Secondary | ICD-10-CM | POA: Diagnosis not present

## 2024-07-11 DIAGNOSIS — Z85820 Personal history of malignant melanoma of skin: Secondary | ICD-10-CM | POA: Diagnosis not present

## 2024-07-11 DIAGNOSIS — D2361 Other benign neoplasm of skin of right upper limb, including shoulder: Secondary | ICD-10-CM | POA: Diagnosis not present

## 2024-10-13 ENCOUNTER — Encounter

## 2024-10-13 ENCOUNTER — Telehealth: Admitting: Emergency Medicine

## 2024-10-13 ENCOUNTER — Other Ambulatory Visit: Payer: Self-pay

## 2024-10-13 ENCOUNTER — Encounter: Payer: Self-pay | Admitting: Internal Medicine

## 2024-10-13 DIAGNOSIS — B9689 Other specified bacterial agents as the cause of diseases classified elsewhere: Secondary | ICD-10-CM

## 2024-10-13 DIAGNOSIS — H60501 Unspecified acute noninfective otitis externa, right ear: Secondary | ICD-10-CM

## 2024-10-13 MED ORDER — CIPROFLOXACIN-DEXAMETHASONE 0.3-0.1 % OT SUSP
4.0000 [drp] | Freq: Two times a day (BID) | OTIC | 0 refills | Status: AC
  Filled 2024-10-13: qty 7.5, 19d supply, fill #0

## 2024-10-13 MED ORDER — AMOXICILLIN-POT CLAVULANATE 875-125 MG PO TABS
1.0000 | ORAL_TABLET | Freq: Two times a day (BID) | ORAL | 0 refills | Status: AC
  Filled 2024-10-13: qty 14, 7d supply, fill #0

## 2024-10-13 MED ORDER — AZELASTINE HCL 0.1 % NA SOLN
2.0000 | Freq: Two times a day (BID) | NASAL | 0 refills | Status: AC
  Filled 2024-10-13: qty 30, 50d supply, fill #0

## 2024-10-13 NOTE — Progress Notes (Signed)
 "    E-Visit for Sinus Problems  We are sorry that you are not feeling well.  Here is how we plan to help!  Based on what you have shared with me it looks like you have sinusitis.  Sinusitis is inflammation and infection in the sinus cavities of the head.  Based on your presentation I believe you most likely have Acute Bacterial Sinusitis.  This is an infection caused by bacteria and is treated with antibiotics. I have prescribed Augmentin  875mg /125mg  one tablet twice daily with food, for 7 days.   Read the package for Afrin carefully. If it contains the drug oxymetazoline, it most likely says to not use for more than 3 days. This medicine can be very helpful but after 3 days can make your congestion worse. I recommend you only use it for 3 days and then stop it. I have prescribed azelastine , a different nose spray, that is ok to use for longer periods of time.   I have not prescribed prednisone  as your symptoms seem different from what Dr. Marylynn describes in her notes last year. You should not need prednisone  for a sinus infection.   You may use an oral decongestant such as Mucinex D or if you have glaucoma or high blood pressure use plain Mucinex. .  Saline nasal spray help and can safely be used as often as needed for congestion.  Try using saline irrigation, such as with a neti pot, several times a day while you are sick. Many neti pots come with salt packets premeasured to use to make saline. If you use your own salt, make sure it is kosher salt or sea salt (don't use table salt as it has iodine  in it and you don't need that in your nose). Use distilled water to make saline. If you mix your own saline using your own salt, the recipe is 1/4 teaspoon salt in 1 cup warm water. Using saline irrigation can help prevent and treat sinus infections.  If you develop worsening sinus pain, fever or notice severe headache and vision changes, or if symptoms are not better after completion of antibiotic, please  schedule an appointment with a health care provider.    Sinus infections are not as easily transmitted as other respiratory infection, however we still recommend that you avoid close contact with loved ones, especially the very young and elderly.  Remember to wash your hands thoroughly throughout the day as this is the number one way to prevent the spread of infection!  Home Care: Only take medications as instructed by your medical team. Complete the entire course of an antibiotic. Do not take these medications with alcohol. A steam or ultrasonic humidifier can help congestion.  You can place a towel over your head and breathe in the steam from hot water coming from a faucet. Avoid close contacts especially the very young and the elderly. Cover your mouth when you cough or sneeze. Always remember to wash your hands.  Get Help Right Away If: You develop worsening fever or sinus pain. You develop a severe head ache or visual changes. Your symptoms persist after you have completed your treatment plan.  Make sure you Understand these instructions. Will watch your condition. Will get help right away if you are not doing well or get worse.  Your e-visit answers were reviewed by a board certified advanced clinical practitioner to complete your personal care plan.  Depending on the condition, your plan could have included both over the counter  or prescription medications.  If there is a problem please reply  once you have received a response from your provider.  Your safety is important to us .  If you have drug allergies check your prescription carefully.    You can use MyChart to ask questions about todays visit, request a non-urgent call back, or ask for a work or school excuse for 24 hours related to this e-Visit. If it has been greater than 24 hours you will need to follow up with your provider, or enter a new e-Visit to address those concerns.  You will get an e-mail in the next two days  asking about your experience.  I hope that your e-visit has been valuable and will speed your recovery. Thank you for using e-visits.  I have spent 5 minutes in review of e-visit questionnaire, review and updating patient chart, medical decision making and response to patient.   Jon Belt, PhD, FNP-BC       "

## 2024-10-13 NOTE — Addendum Note (Signed)
 Addended by: Davari Lopes M on: 10/13/2024 02:14 PM   Modules accepted: Orders

## 2024-10-14 ENCOUNTER — Other Ambulatory Visit: Payer: Self-pay

## 2024-10-14 ENCOUNTER — Other Ambulatory Visit: Payer: Self-pay | Admitting: Internal Medicine

## 2024-10-14 MED ORDER — PREDNISONE 10 MG PO TABS
ORAL_TABLET | ORAL | 0 refills | Status: AC
  Filled 2024-10-14: qty 21, 6d supply, fill #0

## 2024-10-14 NOTE — Telephone Encounter (Signed)
 Noted.

## 2024-12-28 ENCOUNTER — Encounter: Admitting: Internal Medicine
